# Patient Record
Sex: Female | Born: 1980 | Race: Black or African American | Hispanic: No | Marital: Single | Smoking: Former smoker
Health system: Southern US, Community
[De-identification: ages and names within clinical notes are randomized; demographics above are authoritative.]

## PROBLEM LIST (undated history)

## (undated) DIAGNOSIS — I10 Essential (primary) hypertension: Secondary | ICD-10-CM

## (undated) DIAGNOSIS — R768 Other specified abnormal immunological findings in serum: Secondary | ICD-10-CM

## (undated) DIAGNOSIS — Z789 Other specified health status: Secondary | ICD-10-CM

## (undated) DIAGNOSIS — R011 Cardiac murmur, unspecified: Secondary | ICD-10-CM

## (undated) DIAGNOSIS — O23592 Infection of other part of genital tract in pregnancy, second trimester: Secondary | ICD-10-CM

## (undated) DIAGNOSIS — A599 Trichomoniasis, unspecified: Secondary | ICD-10-CM

## (undated) DIAGNOSIS — A5901 Trichomonal vulvovaginitis: Secondary | ICD-10-CM

## (undated) HISTORY — DX: Infection of other part of genital tract in pregnancy, second trimester: O23.592

## (undated) HISTORY — DX: Other specified health status: Z78.9

## (undated) HISTORY — DX: Other specified abnormal immunological findings in serum: R76.8

## (undated) HISTORY — PX: NO PAST SURGERIES: SHX2092

## (undated) HISTORY — DX: Trichomoniasis, unspecified: A59.9

## (undated) HISTORY — DX: Trichomonal vulvovaginitis: A59.01

---

## 2002-10-11 ENCOUNTER — Other Ambulatory Visit: Admission: RE | Admit: 2002-10-11 | Discharge: 2002-10-11 | Payer: Self-pay | Admitting: Unknown Physician Specialty

## 2010-06-24 ENCOUNTER — Emergency Department (HOSPITAL_COMMUNITY): Admission: EM | Admit: 2010-06-24 | Discharge: 2010-06-24 | Payer: Self-pay | Admitting: Emergency Medicine

## 2010-11-11 LAB — POCT PREGNANCY, URINE: Preg Test, Ur: NEGATIVE

## 2011-12-10 ENCOUNTER — Encounter (HOSPITAL_COMMUNITY): Payer: Self-pay

## 2011-12-10 ENCOUNTER — Emergency Department (HOSPITAL_COMMUNITY)
Admission: EM | Admit: 2011-12-10 | Discharge: 2011-12-10 | Disposition: A | Discharge status: Home / Self Care | Payer: Self-pay | Attending: Emergency Medicine | Admitting: Emergency Medicine

## 2011-12-10 DIAGNOSIS — IMO0001 Reserved for inherently not codable concepts without codable children: Secondary | ICD-10-CM | POA: Insufficient documentation

## 2011-12-10 DIAGNOSIS — R197 Diarrhea, unspecified: Secondary | ICD-10-CM | POA: Insufficient documentation

## 2011-12-10 DIAGNOSIS — B9789 Other viral agents as the cause of diseases classified elsewhere: Secondary | ICD-10-CM | POA: Insufficient documentation

## 2011-12-10 DIAGNOSIS — B349 Viral infection, unspecified: Secondary | ICD-10-CM

## 2011-12-10 DIAGNOSIS — R112 Nausea with vomiting, unspecified: Secondary | ICD-10-CM | POA: Insufficient documentation

## 2011-12-10 LAB — URINALYSIS, ROUTINE W REFLEX MICROSCOPIC
Bilirubin Urine: NEGATIVE
Glucose, UA: NEGATIVE mg/dL
Ketones, ur: NEGATIVE mg/dL
pH: 7 (ref 5.0–8.0)

## 2011-12-10 MED ORDER — DIPHENOXYLATE-ATROPINE 2.5-0.025 MG PO TABS
ORAL_TABLET | ORAL | Status: AC
  Filled 2011-12-10: qty 2

## 2011-12-10 MED ORDER — MORPHINE SULFATE 2 MG/ML IJ SOLN
2.0000 mg | Freq: Once | INTRAMUSCULAR | Status: AC
  Administered 2011-12-10: 2 mg via INTRAVENOUS
  Filled 2011-12-10: qty 1

## 2011-12-10 MED ORDER — ONDANSETRON HCL 4 MG/2ML IJ SOLN
4.0000 mg | Freq: Once | INTRAMUSCULAR | Status: AC
  Administered 2011-12-10: 4 mg via INTRAVENOUS
  Filled 2011-12-10: qty 2

## 2011-12-10 MED ORDER — SODIUM CHLORIDE 0.9 % IV SOLN: Freq: Once | INTRAVENOUS | Status: DC

## 2011-12-10 MED ORDER — KETOROLAC TROMETHAMINE 30 MG/ML IJ SOLN
30.0000 mg | Freq: Once | INTRAMUSCULAR | Status: AC
  Administered 2011-12-10: 30 mg via INTRAVENOUS
  Filled 2011-12-10: qty 1

## 2011-12-10 MED ORDER — DIPHENOXYLATE-ATROPINE 2.5-0.025 MG PO TABS
2.0000 | ORAL_TABLET | Freq: Once | ORAL | Status: AC
  Administered 2011-12-10: 2 via ORAL

## 2011-12-10 MED ORDER — SODIUM CHLORIDE 0.9 % IV BOLUS (SEPSIS)
1000.0000 mL | Freq: Once | INTRAVENOUS | Status: AC
  Administered 2011-12-10: 1000 mL via INTRAVENOUS

## 2011-12-10 MED ORDER — PROMETHAZINE HCL 25 MG PO TABS: 12.5000 mg | ORAL_TABLET | Freq: Four times a day (QID) | ORAL | Status: DC | PRN

## 2011-12-10 NOTE — ED Notes (Signed)
Nausea, vomiting and diarrhea, body aches onset earlier Thursday

## 2011-12-10 NOTE — ED Notes (Signed)
Pt alert & oriented x4, stable gait. Pt given discharge instructions, paperwork & prescription(s). Patient instructed to stop at the registration desk to finish any additional paperwork. pt verbalized understanding. Pt left department w/ no further questions.  

## 2011-12-10 NOTE — ED Provider Notes (Signed)
History     CSN: [id number]  Arrival date & time 12/10/11  7829   First MD Initiated Contact with Patient 12/10/11 0153      Chief Complaint  Patient presents with  . Nausea  . Diarrhea  . Generalized Body Aches    (Consider location/radiation/quality/duration/timing/severity/associated sxs/prior treatment) HPI Olivia Brooks is a 31 y.o. female who presents to the Emergency Department complaining of nausea, vomiting, diarrhea, and body aches that began yesterday morning. She has had multiple episodes of vomiting and diarrhea. Denies fever, cough, shortness of breath. She is taking no medicines. History reviewed. No pertinent past medical history.  History reviewed. No pertinent past surgical history.  No family history on file.  History  Substance Use Topics  . Smoking status: Current Everyday Smoker  . Smokeless tobacco: Not on file  . Alcohol Use: Yes    OB History    Grav Para Term Preterm Abortions TAB SAB Ect Mult Living                  Review of Systems ROS: Statement: All systems negative except as marked or noted in the HPI; Constitutional: Negative for fever  ; Eyes: Negative for eye pain, redness and discharge. ; ; ENMT: Negative for ear pain, hoarseness, nasal congestion, sinus pressure and sore throat. ; ; Cardiovascular: Negative for chest pain, palpitations, diaphoresis, dyspnea and peripheral edema. ; ; Respiratory: Negative for cough, wheezing and stridor. ; ; Gastrointestinal:Positive e for nausea, vomiting, diarrhea, abdominal pain,  Negative for blood in stool, hematemesis, jaundice and rectal bleeding. . ; ; Genitourinary: Negative for dysuria, flank pain and hematuria. ; ; Musculoskeletal: Positive for generalized muscle aches, Negative for back pain and neck pain. Negative for swelling and trauma.; ; Skin: Negative for pruritus, rash, abrasions, blisters, bruising and skin lesion.; ; Neuro: Negative for headache, lightheadedness and neck stiffness.  Negative for weakness, altered level of consciousness , altered mental status, extremity weakness, paresthesias, involuntary movement, seizure and syncope.     Allergies  Review of patient's allergies indicates no known allergies.  Home Medications  No current outpatient prescriptions on file.  BP 147/86  Pulse 93  Temp(Src) 98.6 F (37 C) (Oral)  Resp 16  Ht 5\' 9"  (1.753 m)  Wt 240 lb (108.863 kg)  BMI 35.44 kg/m2  SpO2 100%  LMP 11/28/2011  Physical Exam Physical examination:  Nursing notes reviewed; Vital signs and O2 SAT reviewed;  Constitutional: Well developed, Well nourished, Well hydrated, In no acute distress; Head:  Normocephalic, atraumatic; Eyes: EOMI, PERRL, No scleral icterus; ENMT: Mouth and pharynx normal, Mucous membranes moist; Neck: Supple, Full range of motion, No lymphadenopathy; Cardiovascular: Regular rate and rhythm, No murmur, rub, or gallop; Respiratory: Breath sounds clear & equal bilaterally, No rales, rhonchi, wheezes, or rub, Normal respiratory effort/excursion; Chest: Nontender, Movement normal; Abdomen: Soft, Nontender, Nondistended, Normal bowel sounds; Genitourinary: No CVA tenderness; Extremities: Pulses normal, No tenderness, No edema, No calf edema or asymmetry.; Neuro: AA&Ox3, Major CN grossly intact.  No gross focal motor or sensory deficits in extremities.; Skin: Color normal, Warm, Dry  ED Course  Procedures (including critical care time) Results for orders placed during the hospital encounter of 06/24/10  POCT PREGNANCY, URINE      Component Value Range   Preg Test, Ur       Value: NEGATIVE            THE SENSITIVITY OF THIS     METHODOLOGY IS >24 mIU/mL  MDM  She presents with nausea, vomiting, diarrhea, and muscle aches that began yesterday. Given IV fluids, and diabetic, analgesics, anti-inflammatory, and Lomotil with improvement. She is unable to take by mouth fluids.Pt stable in ED with no significant deterioration in  condition.The patient appears reasonably screened and/or stabilized for discharge and I doubt any other medical condition or other [facility] requiring further screening, evaluation, or treatment in the ED at this time prior to discharge.  MDM Reviewed: nursing note and vitals Interpretation: labs           Nicoletta Dress. Colon Branch, MD 12/10/11 [phone]

## 2011-12-10 NOTE — Discharge Instructions (Signed)
Drink lots of fluids.  Bland diet for the next 6-8 hours then progress as you can tolerate. Use the nausea medicine as needed. Use BRAT for diarrhea. Follow up with your doctor.   B.R.A.T. Diet Your doctor has recommended the B.R.A.T. diet for you or your child until the condition improves. This is often used to help control diarrhea and vomiting symptoms. If you or your child can tolerate clear liquids, you may have:  Bananas.   Rice.   Applesauce.   Toast (and other simple starches such as crackers, potatoes, noodles).  Be sure to avoid dairy products, meats, and fatty foods until symptoms are better. Fruit juices such as apple, grape, and prune juice can make diarrhea worse. Avoid these. Continue this diet for 2 days or as instructed by your caregiver. Document Released: 08/16/2005 Document Revised: 08/05/2011 Document Reviewed: 02/02/2007 Exit[employer] Patient Information 2012 [employer]. 

## 2014-04-21 ENCOUNTER — Encounter (HOSPITAL_COMMUNITY): Payer: Self-pay | Admitting: Emergency Medicine

## 2014-04-21 ENCOUNTER — Emergency Department (HOSPITAL_COMMUNITY): Payer: Managed Care, Other (non HMO)

## 2014-04-21 ENCOUNTER — Emergency Department (HOSPITAL_COMMUNITY)
Admission: EM | Admit: 2014-04-21 | Discharge: 2014-04-21 | Disposition: A | Discharge status: Home / Self Care | Payer: Managed Care, Other (non HMO) | Attending: Emergency Medicine | Admitting: Emergency Medicine

## 2014-04-21 DIAGNOSIS — M79605 Pain in left leg: Secondary | ICD-10-CM

## 2014-04-21 DIAGNOSIS — F17200 Nicotine dependence, unspecified, uncomplicated: Secondary | ICD-10-CM | POA: Insufficient documentation

## 2014-04-21 DIAGNOSIS — Z3202 Encounter for pregnancy test, result negative: Secondary | ICD-10-CM | POA: Diagnosis not present

## 2014-04-21 DIAGNOSIS — M79609 Pain in unspecified limb: Secondary | ICD-10-CM | POA: Diagnosis not present

## 2014-04-21 LAB — POC URINE PREG, ED: PREG TEST UR: NEGATIVE

## 2014-04-21 MED ORDER — NAPROXEN 250 MG PO TABS
500.0000 mg | ORAL_TABLET | Freq: Once | ORAL | Status: AC
  Administered 2014-04-21: 500 mg via ORAL
  Filled 2014-04-21: qty 2

## 2014-04-21 MED ORDER — METHOCARBAMOL 500 MG PO TABS: 500.0000 mg | ORAL_TABLET | Freq: Two times a day (BID) | ORAL | Status: DC | PRN

## 2014-04-21 MED ORDER — NAPROXEN 500 MG PO TABS: 500.0000 mg | ORAL_TABLET | Freq: Two times a day (BID) | ORAL | Status: DC

## 2014-04-21 NOTE — ED Provider Notes (Signed)
CSN: [id number]     Arrival date & time 04/21/14  1919 History  This chart was scribed for Johnna Acosta, MD by Cathie Hoops, ED Scribe. The patient was seen in [place]. The patient's care was started at 8:54 PM.     Chief Complaint  Patient presents with  . Leg Pain    HPI HPI Comments: Olivia Brooks is a 33 y.o. female who presents to the Emergency Department complaining of new, gradually worsening upper left leg pain. Pt describes her pain as deep and throbbing. Pt reports pain with ROM. Pt reports similar symptoms when she is menstruating but always resolves once she stops menstruating. Pt denies pregnancy. No bug bites, rashes or redness, fever, chills, abdominal pain, appetite change . No pain in upper extremities. Pt denies diarrhea. 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 Pt denies stretching or exercising.  She does not stretch or exercise.  She has had normal menstrual cycles with regards to bleeding.  No trauma.  No swelling.  No other rf for DVT  Good pulses of the feet History reviewed. No pertinent past medical history. History reviewed. No pertinent past surgical history. History reviewed. No pertinent family history. History  Substance Use Topics  . Smoking status: Current Every Day Smoker  . Smokeless tobacco: Not on file  . Alcohol Use: Yes   OB History   Grav Para Term Preterm Abortions TAB SAB Ect Mult Living                 Review of Systems  Musculoskeletal: Positive for myalgias. Negative for back pain, joint swelling and neck stiffness.  Skin: Negative for color change, rash and wound.      Allergies  Review of patient's allergies indicates no known allergies.  Home Medications   Prior to Admission medications   Medication Sig Start Date End Date Taking? Authorizing Provider  naproxen sodium (ALEVE) 220 MG tablet Take 220-440 mg by mouth daily as needed (for pain).   Yes Historical Provider, MD  methocarbamol (ROBAXIN) 500 MG tablet  Take 1 tablet (500 mg total) by mouth 2 (two) times daily as needed for muscle spasms. 04/21/14   Johnna Acosta, MD  naproxen (NAPROSYN) 500 MG tablet Take 1 tablet (500 mg total) by mouth 2 (two) times daily with a meal. 04/21/14   Johnna Acosta, MD  promethazine (PHENERGAN) 25 MG tablet Take 0.5 tablets (12.5 mg total) by mouth every 6 (six) hours as needed for nausea. 12/10/11 12/17/11  Prentiss Bells, MD   Triage Vitals: BP 160/106  Pulse 89  Temp(Src) 98.6 F (37 C) (Oral)  Resp 20  Ht 5\' 8"  (1.727 m)  Wt 283 lb 8 oz (128.595 kg)  BMI 43.12 kg/m2  SpO2 100%  LMP 04/12/2014 Physical Exam  Nursing note and vitals reviewed. Constitutional: She appears well-developed and well-nourished. No distress.  HENT:  Head: Normocephalic and atraumatic.  Mouth/Throat: Oropharynx is clear and moist. No oropharyngeal exudate.  Eyes: Conjunctivae and EOM are normal. Pupils are equal, round, and reactive to light. Right eye exhibits no discharge. Left eye exhibits no discharge. No scleral icterus.  Neck: Normal range of motion. Neck supple. No JVD present. No thyromegaly present.  Cardiovascular: Normal rate, regular rhythm, normal heart sounds and intact distal pulses.  Exam reveals no gallop and no friction rub.   No murmur heard. Pulmonary/Chest: Effort normal and breath sounds normal. No respiratory distress. She has no wheezes. She has no rales.  Abdominal: Soft. Bowel sounds are normal. She exhibits no distension and no mass. There is no tenderness.  Musculoskeletal: Normal range of motion. She exhibits tenderness ( ttp with use of hip flexors.  no rash, reprotudible ttp to palpation of proximal thigh.  no pain with ROM of hip or knee.). She exhibits no edema.  Lymphadenopathy:    She has no cervical adenopathy.  Neurological: She is alert. Coordination normal.  Skin: Skin is warm and dry. No rash noted. No erythema.  Psychiatric: She has a normal mood and affect. Her behavior is normal.     ED Course  Procedures (including critical care time) DIAGNOSTIC STUDIES: Oxygen Saturation is 100% on RA, normal by my interpretation.    COORDINATION OF CARE: 8:59 PM- Patient informed of current plan for treatment and evaluation and agrees with plan at this time.    Labs Review Labs Reviewed  POC URINE PREG, ED    Imaging Review Dg Femur Left  04/21/2014   CLINICAL DATA:  Pain  EXAM: LEFT FEMUR - 2 VIEW  COMPARISON:  None.  FINDINGS: Frontal and lateral views were obtained. There is no fracture or dislocation. There is mild narrowing in the left hip joint. No erosive change. No abnormal periosteal reaction.  IMPRESSION: Mild narrowing left hip joint. No fracture or dislocation. No abnormal periosteal reaction.   Electronically Signed   By: Lowella Grip M.D.   On: 04/21/2014 21:56  remin    MDM   Final diagnoses:  Leg pain, anterior, left    Pt has muscular pai - r/o bony pathology.  Pain meds ordered.  Negative for acute fracture, medications given as below. Likely muscle injury  I personally performed the services described in this documentation, which was scribed in my presence. The recorded information has been reviewed and is accurate.  Meds given in ED:  Medications  naproxen (NAPROSYN) tablet 500 mg (500 mg Oral Given 04/21/14 2118)    New Prescriptions   METHOCARBAMOL (ROBAXIN) 500 MG TABLET    Take 1 tablet (500 mg total) by mouth 2 (two) times daily as needed for muscle spasms.   NAPROXEN (NAPROSYN) 500 MG TABLET    Take 1 tablet (500 mg total) by mouth 2 (two) times daily with a meal.     .  Johnna Acosta, MD 04/21/14 2329

## 2014-04-21 NOTE — Discharge Instructions (Signed)
Please have your blood pressure rechecked in 2 weeks - it was elevated today.    Your xray was normal.  Please call your doctor for a followup appointment within 24-48 hours. When you talk to your doctor please let them know that you were seen in the emergency department and have them acquire all of your records so that they can discuss the findings with you and formulate a treatment plan to fully care for your new and ongoing problems.   [facility] Primary Care Doctor List    Sinda Du MD. Specialty: Pulmonary Disease Contact information: [place] [address]  [phone]   Tula Nakayama, MD. Specialty: [facility] Medicine Contact information: [address], [address] [place] [address]  [phone]   Sallee Lange, MD. Specialty: [facility] Medicine Contact information: [place]  [place] [address]  [phone]   Rosita Fire, MD Specialty: Internal Medicine Contact information: [place] [address]  [phone]   Delphina Cahill, MD. Specialty: Internal Medicine Contact information: [place] [address]  [phone]   Marjean Donna, MD. Specialty: Family Medicine Contact information: [place] [address]  [phone]   Leslie Andrea, MD. Specialty: [facility] Medicine Contact information: [place] [address]  [phone]   Asencion Noble, MD. Specialty: Internal Medicine Contact information: [place]  [place] [address]  [phone]

## 2014-04-21 NOTE — ED Notes (Signed)
Pt c/o left leg pain x 3 weeks;

## 2014-12-19 ENCOUNTER — Emergency Department (HOSPITAL_COMMUNITY)
Admission: EM | Admit: 2014-12-19 | Discharge: 2014-12-20 | Disposition: A | Discharge status: Home / Self Care | Payer: BLUE CROSS/BLUE SHIELD | Attending: Emergency Medicine | Admitting: Emergency Medicine

## 2014-12-19 ENCOUNTER — Emergency Department (HOSPITAL_COMMUNITY): Payer: BLUE CROSS/BLUE SHIELD

## 2014-12-19 ENCOUNTER — Encounter (HOSPITAL_COMMUNITY): Payer: Self-pay | Admitting: Cardiology

## 2014-12-19 DIAGNOSIS — O99331 Smoking (tobacco) complicating pregnancy, first trimester: Secondary | ICD-10-CM | POA: Diagnosis not present

## 2014-12-19 DIAGNOSIS — M5441 Lumbago with sciatica, right side: Secondary | ICD-10-CM

## 2014-12-19 DIAGNOSIS — O9989 Other specified diseases and conditions complicating pregnancy, childbirth and the puerperium: Secondary | ICD-10-CM | POA: Diagnosis present

## 2014-12-19 DIAGNOSIS — M5440 Lumbago with sciatica, unspecified side: Secondary | ICD-10-CM | POA: Diagnosis not present

## 2014-12-19 DIAGNOSIS — O99211 Obesity complicating pregnancy, first trimester: Secondary | ICD-10-CM | POA: Insufficient documentation

## 2014-12-19 DIAGNOSIS — Z3A01 Less than 8 weeks gestation of pregnancy: Secondary | ICD-10-CM | POA: Insufficient documentation

## 2014-12-19 DIAGNOSIS — E669 Obesity, unspecified: Secondary | ICD-10-CM | POA: Insufficient documentation

## 2014-12-19 DIAGNOSIS — F17200 Nicotine dependence, unspecified, uncomplicated: Secondary | ICD-10-CM | POA: Diagnosis not present

## 2014-12-19 DIAGNOSIS — M5442 Lumbago with sciatica, left side: Secondary | ICD-10-CM

## 2014-12-19 DIAGNOSIS — O99891 Other specified diseases and conditions complicating pregnancy: Secondary | ICD-10-CM

## 2014-12-19 DIAGNOSIS — M549 Dorsalgia, unspecified: Secondary | ICD-10-CM

## 2014-12-19 DIAGNOSIS — Z3490 Encounter for supervision of normal pregnancy, unspecified, unspecified trimester: Secondary | ICD-10-CM

## 2014-12-19 LAB — URINALYSIS, ROUTINE W REFLEX MICROSCOPIC
BILIRUBIN URINE: NEGATIVE
Glucose, UA: NEGATIVE mg/dL
HGB URINE DIPSTICK: NEGATIVE
KETONES UR: NEGATIVE mg/dL
Leukocytes, UA: NEGATIVE
Nitrite: NEGATIVE
PROTEIN: NEGATIVE mg/dL
Urobilinogen, UA: 0.2 mg/dL (ref 0.0–1.0)
pH: 6 (ref 5.0–8.0)

## 2014-12-19 LAB — I-STAT BETA HCG BLOOD, ED (MC, WL, AP ONLY): I-stat hCG, quantitative: >2000 m[IU]/mL — ABNORMAL HIGH (ref <5)

## 2014-12-19 MED ORDER — ACETAMINOPHEN-CODEINE #3 300-30 MG PO TABS: 1.0000 | ORAL_TABLET | ORAL | Status: DC | PRN

## 2014-12-19 MED ORDER — HYDROCODONE-ACETAMINOPHEN 5-325 MG PO TABS
1.0000 | ORAL_TABLET | Freq: Once | ORAL | Status: AC
  Administered 2014-12-19: 1 via ORAL
  Filled 2014-12-19: qty 1

## 2014-12-19 NOTE — Discharge Instructions (Signed)

## 2014-12-19 NOTE — ED Notes (Addendum)
Lower back pain that radiates into both legs.    Pt is pregnant.

## 2014-12-19 NOTE — ED Provider Notes (Signed)
CSN: [id number]     Arrival date & time 12/19/14  1745 History   First MD Initiated Contact with Patient 12/19/14 1838     Chief Complaint  Patient presents with  . Back Pain     (Consider location/radiation/quality/duration/timing/severity/associated sxs/prior Treatment) HPI  Olivia Brooks is a 34 y.o. female [id number] who presents to the Emergency Department complaining of diffuse, low back pain for 2 days.  She states that she recently had a confirmed pregnancy test at her PMD's office and has initial appt with her OB on Monday.  She c/o sharp pain to her lower back and that radiates into her buttocks and down the backside of both legs.  She states pain is worse with movement, twisting and walking.  She states she stands all day at her job, but denies known injury.  She has taken one ibuprofen yesterday which did not alleviate her pain.  She denies chest pain, shortness of breath, abdominal pain, pelvic pain, vaginal bleeding or dysuria.     History reviewed. No pertinent past medical history. History reviewed. No pertinent past surgical history. History reviewed. No pertinent family history. History  Substance Use Topics  . Smoking status: Current Every Day Smoker  . Smokeless tobacco: Not on file  . Alcohol Use: Yes   OB History    No data available     Review of Systems  Constitutional: Negative for fever.  Respiratory: Negative for chest tightness and shortness of breath.   Cardiovascular: Negative for chest pain.  Gastrointestinal: Negative for nausea, vomiting, abdominal pain and constipation.  Genitourinary: Negative for dysuria, frequency, hematuria, flank pain, decreased urine volume, vaginal bleeding, vaginal discharge and difficulty urinating.  Musculoskeletal: Positive for back pain. Negative for joint swelling.  Skin: Negative for rash.  Neurological: Negative for weakness and numbness.  All other systems reviewed and are negative.     Allergies  Review  of patient's allergies indicates no known allergies.  Home Medications   Prior to Admission medications   Medication Sig Start Date End Date Taking? Authorizing Provider  ibuprofen (ADVIL,MOTRIN) 200 MG tablet Take 800 mg by mouth every 6 (six) hours as needed for headache, mild pain or moderate pain.   Yes Historical Provider, MD  acetaminophen-codeine (TYLENOL #3) 300-30 MG per tablet Take 1 tablet by mouth every 4 (four) hours as needed for moderate pain. 12/19/14   Daquisha Clermont, PA-C  methocarbamol (ROBAXIN) 500 MG tablet Take 1 tablet (500 mg total) by mouth 2 (two) times daily as needed for muscle spasms. Patient not taking: Reported on 12/19/2014 04/21/14   Noemi Chapel, MD  naproxen (NAPROSYN) 500 MG tablet Take 1 tablet (500 mg total) by mouth 2 (two) times daily with a meal. Patient not taking: Reported on 12/19/2014 04/21/14   Noemi Chapel, MD   BP 138/62 mmHg  Pulse 70  Temp(Src) 98.2 F (36.8 C) (Oral)  Resp 20  Ht 5\' 8"  (1.727 m)  Wt 300 lb (136.079 kg)  BMI 45.63 kg/m2  SpO2 99%  LMP 11/02/2014 Physical Exam  Constitutional: She is oriented to person, place, and time. She appears well-developed and well-nourished. No distress.  Pt is obese  HENT:  Head: Normocephalic and atraumatic.  Neck: Normal range of motion. Neck supple.  Cardiovascular: Normal rate, regular rhythm, normal heart sounds and intact distal pulses.   No murmur heard. Pulmonary/Chest: Effort normal and breath sounds normal. No respiratory distress. She exhibits no tenderness.  Abdominal: Soft. She exhibits no distension. There is no  tenderness. There is no rebound and no guarding.  Musculoskeletal: Normal range of motion. She exhibits tenderness. She exhibits no edema.       Lumbar back: She exhibits tenderness and pain. She exhibits normal range of motion, no swelling, no deformity, no laceration and normal pulse.  Diffuse ttp of the bilateral lumbar paraspinal muscles.  No spinal tenderness.  DP  pulses are brisk and symmetrical.  Distal sensation intact.  Hip Flexors/Extensors are intact.  Pt has 5/5 strength against resistance of bilateral lower extremities.     Neurological: She is alert and oriented to person, place, and time. She has normal strength. No sensory deficit. She exhibits normal muscle tone. Coordination and gait normal.  Reflex Scores:      Patellar reflexes are 2+ on the right side and 2+ on the left side.      Achilles reflexes are 2+ on the right side and 2+ on the left side. Skin: Skin is warm and dry. No rash noted.  Nursing note and vitals reviewed.   ED Course  Procedures (including critical care time) Labs Review Labs Reviewed  URINALYSIS, ROUTINE W REFLEX MICROSCOPIC - Abnormal; Notable for the following:    Specific Gravity, Urine >1.030 (*)    All other components within normal limits  I-STAT BETA HCG BLOOD, ED ([facility], [facility], [facility] ONLY) - Abnormal; Notable for the following:    I-stat hCG, quantitative >2000.0 (*)    All other components within normal limits    Imaging Review US Ob Comp Less 14 Wks  12/19/2014   CLINICAL DATA:  Back pain, pregnant.  EXAM: OBSTETRIC <14 WK [place] AND TRANSVAGINAL OB US  TECHNIQUE: Both transabdominal and transvaginal ultrasound examinations were performed for complete evaluation of the gestation as well as the maternal uterus, adnexal regions, and pelvic cul-de-sac. Transvaginal technique was performed to assess early pregnancy.  COMPARISON:  None.  FINDINGS: Intrauterine gestational sac: Visualized/normal in shape.  Yolk sac:  Identified  Embryo:  Identified  Cardiac Activity: Identified  Heart Rate: 145  bpm  CRL:  12  mm   7 w   3 d                  [place] EDC: 08/04/2015  Maternal uterus/adnexae: No subchorionic hemorrhage. Intramural fibroid on the left measuring 3.8 x 3.3 x 3.8 cm. Ovaries not visualized. Trace free fluid.  IMPRESSION: Single intrauterine gestation with cardiac activity documented. Estimated age of 7 weeks 3 days by  crown-rump length.  Fibroid uterus.  Ovaries not visualized.  Trace free fluid.   Electronically Signed   By: Carlos Levering M.D.   On: 12/19/2014 23:06   US Ob Transvaginal  12/19/2014   CLINICAL DATA:  Back pain, pregnant.  EXAM: OBSTETRIC <14 WK [place] AND TRANSVAGINAL OB US  TECHNIQUE: Both transabdominal and transvaginal ultrasound examinations were performed for complete evaluation of the gestation as well as the maternal uterus, adnexal regions, and pelvic cul-de-sac. Transvaginal technique was performed to assess early pregnancy.  COMPARISON:  None.  FINDINGS: Intrauterine gestational sac: Visualized/normal in shape.  Yolk sac:  Identified  Embryo:  Identified  Cardiac Activity: Identified  Heart Rate: 145  bpm  CRL:  12  mm   7 w   3 d                  [place] EDC: 08/04/2015  Maternal uterus/adnexae: No subchorionic hemorrhage. Intramural fibroid on the left measuring 3.8 x 3.3 x 3.8 cm. Ovaries not  visualized. Trace free fluid.  IMPRESSION: Single intrauterine gestation with cardiac activity documented. Estimated age of 7 weeks 3 days by crown-rump length.  Fibroid uterus.  Ovaries not visualized.  Trace free fluid.   Electronically Signed   By: Carlos Levering M.D.   On: 12/19/2014 23:06     EKG Interpretation None      MDM   Final diagnoses:  Bilateral low back pain with sciatica, sciatica laterality unspecified  Pregnancy    Pt is ambulatory in the dept w/o difficulty, no focal neuro deficits on exam.  Diffuse radicular low back pain that radiates to bilateral lower extremities.  Pain likely related to sciatica.  No concerning sx's for emergent neurological process.   Pt also seen by Dr. Wyvonnia Dusky and care plan discussed.  Unable to view fetus by bedside US.  Will order transvaginal US to r/o ectopic.  No abd pain, vag bleeding, chest pain or dyspnea.  Pt has been resting comfortably during ED stay, family at bedside.  Discussed labs and [place] results with her.  She has appt on Monday with  OB, [facility].  She was advised to return here for any worsening sx's.  Pt verbalized understanding and agrees to plan.   Kem Parkinson, PA-C 12/20/14 0013  Ezequiel Essex, MD 12/20/14 [phone]

## 2014-12-20 ENCOUNTER — Encounter: Payer: Self-pay | Admitting: *Deleted

## 2014-12-20 ENCOUNTER — Other Ambulatory Visit: Payer: Self-pay | Admitting: Obstetrics & Gynecology

## 2014-12-23 ENCOUNTER — Other Ambulatory Visit: Payer: Self-pay

## 2015-01-01 ENCOUNTER — Ambulatory Visit (INDEPENDENT_AMBULATORY_CARE_PROVIDER_SITE_OTHER): Payer: BLUE CROSS/BLUE SHIELD | Admitting: Women's Health

## 2015-01-01 ENCOUNTER — Encounter: Payer: Self-pay | Admitting: Women's Health

## 2015-01-01 VITALS — BP 142/80 | HR 68 | Wt 308.0 lb

## 2015-01-01 DIAGNOSIS — Z0283 Encounter for blood-alcohol and blood-drug test: Secondary | ICD-10-CM

## 2015-01-01 DIAGNOSIS — E669 Obesity, unspecified: Secondary | ICD-10-CM

## 2015-01-01 DIAGNOSIS — A5901 Trichomonal vulvovaginitis: Secondary | ICD-10-CM | POA: Diagnosis not present

## 2015-01-01 DIAGNOSIS — O23591 Infection of other part of genital tract in pregnancy, first trimester: Secondary | ICD-10-CM

## 2015-01-01 DIAGNOSIS — Z331 Pregnant state, incidental: Secondary | ICD-10-CM

## 2015-01-01 DIAGNOSIS — Z1389 Encounter for screening for other disorder: Secondary | ICD-10-CM

## 2015-01-01 DIAGNOSIS — Z369 Encounter for antenatal screening, unspecified: Secondary | ICD-10-CM

## 2015-01-01 DIAGNOSIS — O09899 Supervision of other high risk pregnancies, unspecified trimester: Secondary | ICD-10-CM | POA: Insufficient documentation

## 2015-01-01 DIAGNOSIS — Z3682 Encounter for antenatal screening for nuchal translucency: Secondary | ICD-10-CM

## 2015-01-01 DIAGNOSIS — O26851 Spotting complicating pregnancy, first trimester: Secondary | ICD-10-CM | POA: Insufficient documentation

## 2015-01-01 DIAGNOSIS — O3410 Maternal care for benign tumor of corpus uteri, unspecified trimester: Secondary | ICD-10-CM

## 2015-01-01 DIAGNOSIS — Z3401 Encounter for supervision of normal first pregnancy, first trimester: Secondary | ICD-10-CM

## 2015-01-01 DIAGNOSIS — D259 Leiomyoma of uterus, unspecified: Secondary | ICD-10-CM | POA: Insufficient documentation

## 2015-01-01 LAB — POCT WET PREP (WET MOUNT): Clue Cells Wet Prep Whiff POC: NEGATIVE

## 2015-01-01 MED ORDER — METRONIDAZOLE 500 MG PO TABS: 2000.0000 mg | ORAL_TABLET | Freq: Once | ORAL | Status: DC

## 2015-01-01 MED ORDER — PRENATAL PLUS 27-1 MG PO TABS: 1.0000 | ORAL_TABLET | Freq: Every day | ORAL | Status: DC

## 2015-01-01 NOTE — Patient Instructions (Signed)

## 2015-01-01 NOTE — Progress Notes (Addendum)
  Subjective:  Olivia Brooks is a 34 y.o. G22P0 African American female at [redacted]w[redacted]d by 7wk u/s,  being seen today for her first obstetrical visit.  Her obstetrical history is significant for obesity & primigravida.  Pregnancy history fully reviewed.  Patient reports light spotting x 1wk, no recent sex, just when wipes. Some LBP- went to hospital for LBP- was given tylenol #3. Denies vb, cramping, uti s/s, abnormal/malodorous vag d/c, or vulvovaginal itching/irritation.  BP 142/80 mmHg  Pulse 68  Wt 308 lb (139.708 kg)  LMP 11/02/2014  HISTORY: OB History  Gravida Para Term Preterm AB SAB TAB Ectopic Multiple Living  1             # Outcome Date GA Lbr Len/2nd Weight Sex Delivery Anes PTL Lv  1 Current              Past Medical History  Diagnosis Date  . Medical history non-contributory    Past Surgical History  Procedure Laterality Date  . No past surgeries     Family History  Problem Relation Age of Onset  . Cancer Mother   . Hyperlipidemia Sister     Exam   System:     General: Well developed & nourished, no acute distress   Skin: Warm & dry, normal coloration and turgor, no rashes   Neurologic: Alert & oriented, normal mood   Cardiovascular: Regular rate & rhythm   Respiratory: Effort & rate normal, LCTAB, acyanotic   Abdomen: Soft, non tender   Extremities: normal strength, tone   Pelvic Exam:    Perineum: Normal perineum   Vulva: Normal, no lesions   Vagina:  Normal mucosa, thin white malodorous d/c, scant spotting   Cervix: Normal, bulbous, appears closed   Uterus: Normal size/shape/contour for GA   Thin prep pap smear 2014 @ [facility], neg per pt  FHR: will get in w/ [facility] to verify fhr   Assessment:   Pregnancy: G1P0 Patient Active Problem List   Diagnosis Date Noted  . Supervision of normal first pregnancy 01/01/2015    Priority: High  . Uterine fibroid during pregnancy, antepartum 01/01/2015  . Spotting affecting pregnancy in first trimester,  antepartum 01/01/2015  . Trichomonal vaginitis during pregnancy in first trimester 01/01/2015    [redacted]w[redacted]d G1P0 New OB visit Elevated bp, no h/o HTN Trichomonas Spotting Mild nausea of pregnancy Obesity   Plan:  Initial labs drawn Continue prenatal vitamins Problem list reviewed and updated Reviewed n/v relief measures and warning s/s to report Reviewed recommended weight gain based on pre-gravid BMI Encouraged well-balanced diet Genetic Screening discussed Integrated Screen: requested Cystic fibrosis screening discussed requested Ultrasound discussed; fetal survey: requested Follow up in 2 weeks for nt/it and visit [place] completed Rx flagyl 2gm po x 1 for trich, to call back w/ partner's name/dob/pharmacy/allergies Only take tylenol#3 if back pain severe, discussed other relief measures Monitor bp Request pap from [facility]  Tawnya Crook CNM, [facility] 01/01/2015 9:26 AM

## 2015-01-02 LAB — URINE CULTURE: ORGANISM ID, BACTERIA: NO GROWTH

## 2015-01-03 LAB — GC/CHLAMYDIA PROBE AMP
CHLAMYDIA, DNA PROBE: NEGATIVE
NEISSERIA GONORRHOEAE BY PCR: NEGATIVE

## 2015-01-06 ENCOUNTER — Telehealth: Payer: Self-pay | Admitting: *Deleted

## 2015-01-06 LAB — PMP SCREEN PROFILE (10S), URINE
Amphetamine Screen, Ur: NEGATIVE ng/mL
Barbiturate Screen, Ur: NEGATIVE ng/mL
Benzodiazepine Screen, Urine: NEGATIVE ng/mL
Cannabinoids Ur Ql Scn: NEGATIVE ng/mL
Cocaine(Metab.)Screen, Urine: NEGATIVE ng/mL
Creatinine(Crt), U: 127.4 mg/dL (ref 20.0–300.0)
METHADONE SCREEN, URINE: NEGATIVE ng/mL
OPIATE SCRN UR: POSITIVE ng/mL
OXYCODONE+OXYMORPHONE UR QL SCN: NEGATIVE ng/mL
PCP Scrn, Ur: NEGATIVE ng/mL
PROPOXYPHENE SCREEN: NEGATIVE ng/mL
Ph of Urine: 6.6 (ref 4.5–8.9)

## 2015-01-06 LAB — CYSTIC FIBROSIS MUTATION 97: GENE DIS ANAL CARRIER INTERP BLD/T-IMP: NOT DETECTED

## 2015-01-06 LAB — CBC
Hematocrit: 35.4 % (ref 34.0–46.6)
Hemoglobin: 11.9 g/dL (ref 11.1–15.9)
MCH: 27.4 pg (ref 26.6–33.0)
MCHC: 33.6 g/dL (ref 31.5–35.7)
MCV: 81 fL (ref 79–97)
PLATELETS: 194 10*3/uL (ref 150–379)
RBC: 4.35 x10E6/uL (ref 3.77–5.28)
RDW: 14.6 % (ref 12.3–15.4)
WBC: 6.7 10*3/uL (ref 3.4–10.8)

## 2015-01-06 LAB — URINALYSIS, ROUTINE W REFLEX MICROSCOPIC
Bilirubin, UA: NEGATIVE
GLUCOSE, UA: NEGATIVE
KETONES UA: NEGATIVE
LEUKOCYTES UA: NEGATIVE
NITRITE UA: NEGATIVE
PROTEIN UA: NEGATIVE
RBC, UA: NEGATIVE
SPEC GRAV UA: 1.021 (ref 1.005–1.030)
Urobilinogen, Ur: 0.2 mg/dL (ref 0.2–1.0)
pH, UA: 7 (ref 5.0–7.5)

## 2015-01-06 LAB — RUBELLA SCREEN: RUBELLA: 2.15 {index} (ref >0.99)

## 2015-01-06 LAB — SICKLE CELL SCREEN: SICKLE CELL SCREEN: NEGATIVE

## 2015-01-06 LAB — VARICELLA ZOSTER ANTIBODY, IGG: Varicella zoster IgG: 3736 index (ref >165)

## 2015-01-06 LAB — ABO/RH: RH TYPE: POSITIVE

## 2015-01-06 LAB — HIV ANTIBODY (ROUTINE TESTING W REFLEX): HIV SCREEN 4TH GENERATION: NONREACTIVE

## 2015-01-06 LAB — RPR: RPR Ser Ql: NONREACTIVE

## 2015-01-06 LAB — ANTIBODY SCREEN: Antibody Screen: NEGATIVE

## 2015-01-06 LAB — HEPATITIS B SURFACE ANTIGEN: Hepatitis B Surface Ag: NEGATIVE

## 2015-01-07 NOTE — Telephone Encounter (Signed)
Pt states went to ER this weekend still positive results for Trich. Pt states took the Flagyl as Knute Neu, CNM prescribed. Pt states should she have a positive Trich when she took the Flagyl on the 01/02/2015?  Per Derrek Monaco, [place] for trich 10 days. Pt verbalized understanding and has next appt scheduled.

## 2015-01-13 ENCOUNTER — Other Ambulatory Visit: Payer: Self-pay | Admitting: Women's Health

## 2015-01-13 ENCOUNTER — Ambulatory Visit (INDEPENDENT_AMBULATORY_CARE_PROVIDER_SITE_OTHER): Payer: BLUE CROSS/BLUE SHIELD | Admitting: Women's Health

## 2015-01-13 ENCOUNTER — Telehealth: Payer: Self-pay | Admitting: *Deleted

## 2015-01-13 ENCOUNTER — Encounter: Payer: Self-pay | Admitting: Women's Health

## 2015-01-13 ENCOUNTER — Ambulatory Visit (INDEPENDENT_AMBULATORY_CARE_PROVIDER_SITE_OTHER): Payer: BLUE CROSS/BLUE SHIELD

## 2015-01-13 VITALS — BP 148/78 | HR 68 | Wt 310.0 lb

## 2015-01-13 DIAGNOSIS — O10919 Unspecified pre-existing hypertension complicating pregnancy, unspecified trimester: Secondary | ICD-10-CM

## 2015-01-13 DIAGNOSIS — Z331 Pregnant state, incidental: Secondary | ICD-10-CM

## 2015-01-13 DIAGNOSIS — O3680X1 Pregnancy with inconclusive fetal viability, fetus 1: Secondary | ICD-10-CM | POA: Diagnosis not present

## 2015-01-13 DIAGNOSIS — Z1389 Encounter for screening for other disorder: Secondary | ICD-10-CM

## 2015-01-13 DIAGNOSIS — Z3682 Encounter for antenatal screening for nuchal translucency: Secondary | ICD-10-CM

## 2015-01-13 DIAGNOSIS — O23591 Infection of other part of genital tract in pregnancy, first trimester: Secondary | ICD-10-CM

## 2015-01-13 DIAGNOSIS — E669 Obesity, unspecified: Secondary | ICD-10-CM

## 2015-01-13 DIAGNOSIS — O26851 Spotting complicating pregnancy, first trimester: Secondary | ICD-10-CM

## 2015-01-13 DIAGNOSIS — O10912 Unspecified pre-existing hypertension complicating pregnancy, second trimester: Secondary | ICD-10-CM

## 2015-01-13 DIAGNOSIS — A5901 Trichomonal vulvovaginitis: Secondary | ICD-10-CM | POA: Diagnosis not present

## 2015-01-13 DIAGNOSIS — O09891 Supervision of other high risk pregnancies, first trimester: Secondary | ICD-10-CM

## 2015-01-13 DIAGNOSIS — Z3401 Encounter for supervision of normal first pregnancy, first trimester: Secondary | ICD-10-CM

## 2015-01-13 LAB — POCT URINALYSIS DIPSTICK
Blood, UA: NEGATIVE
GLUCOSE UA: NEGATIVE
Ketones, UA: NEGATIVE
Leukocytes, UA: NEGATIVE
Nitrite, UA: NEGATIVE
Protein, UA: NEGATIVE

## 2015-01-13 LAB — POCT WET PREP (WET MOUNT): Clue Cells Wet Prep Whiff POC: NEGATIVE

## 2015-01-13 MED ORDER — LABETALOL HCL 200 MG PO TABS: 200.0000 mg | ORAL_TABLET | Freq: Two times a day (BID) | ORAL | Status: DC

## 2015-01-13 MED ORDER — COMPLETENATE 29-1 MG PO CHEW: 1.0000 | CHEWABLE_TABLET | Freq: Every day | ORAL | Status: DC

## 2015-01-13 NOTE — Progress Notes (Signed)
High Risk Pregnancy Diagnosis(es): CHTN dx today G1P0 [redacted]w[redacted]d Estimated Date of Delivery: 08/04/15 BP 148/78 mmHg  Pulse 68  Wt 310 lb (140.615 kg)  LMP 11/02/2014  Urinalysis: Negative HPI:  Still having light pink spotting daily, took flagyl as directed for trichomonas, hasn't had sex since. No cramping. PNV makes her sick, wants NataChew- rx sent in.  BP, weight, and urine reviewed.  No fm yet. Denies cramping, lof, vb, uti s/s.   Fundal Height:  11wks Fetal Heart rate:  168 u/s Edema:  None Spec exam: cx visually closed, no bleeding, scant amount creamy white nonodorous d/c, wet prep: trich neg, all other neg as well  Scheduled for 1st nt/it today, however amber measuring baby at 10wks- too small for NT today. EDC of 12/5 based on [facility] c/w 7wk fetus at [facility]- will keep this edc, and redo NT in 2wks Reviewed warning s/s to report All questions were answered Assessment: [redacted]w[redacted]d CHTN dx today Medication(s) Plans:  rx labetalol 200mg  BID, begin baby asa daily @ 12wks, rx natachew per pt preference Treatment Plan:  Growth u/s at 59, 69, 19, 38wks, 2x/wk testing at 32wks, deliver @ 39wk Follow up in 2wks for high-risk OB appt and 1st nt/it 24hr urine for baseline protein today

## 2015-01-13 NOTE — Progress Notes (Signed)
[place] 10Wks single IUP,pos fht 168bpm,normal rt ov,unable to see lt ov,ant lt fibroid 2.6 x 1.8 x 2.3cm,crl 3.46cm, unable to see nt because of pt body habitus and fetal size.

## 2015-01-13 NOTE — Telephone Encounter (Signed)
Pt states she has an appointment today for u/s but she also wants to see Maudie Mercury to make sure the BV and trich are gone.  Pt states she is still having some spotting.  Advised pt we would work her in with Maudie Mercury after u/s today.

## 2015-01-13 NOTE — Patient Instructions (Addendum)
Begin taking a 81mg  baby aspirin daily at 12 weeks of pregnancy (5/23) to decrease risk of preeclampsia during pregnancy   No sex for at least 7 days from time you have seen any spotting  Hypertension During Pregnancy Hypertension, or high blood pressure, is when there is extra pressure inside your blood vessels that carry blood from the heart to the rest of your body (arteries). It can happen at any time in life, including pregnancy. Hypertension during pregnancy can cause problems for you and your baby. Your baby might not weigh as much as he or she should at birth or might be born early (premature). Very bad cases of hypertension during pregnancy can be life-threatening.  Different types of hypertension can occur during pregnancy. These include:  Chronic hypertension. This happens when a woman has hypertension before pregnancy and it continues during pregnancy.  Gestational hypertension. This is when hypertension develops during pregnancy.  Preeclampsia or toxemia of pregnancy. This is a very serious type of hypertension that develops only during pregnancy. It affects the whole body and can be very dangerous for both mother and baby.  Gestational hypertension and preeclampsia usually go away after your baby is born. Your blood pressure will likely stabilize within 6 weeks. Women who have hypertension during pregnancy have a greater chance of developing hypertension later in life or with future pregnancies. RISK FACTORS There are certain factors that make it more likely for you to develop hypertension during pregnancy. These include:  Having hypertension before pregnancy.  Having hypertension during a previous pregnancy.  Being overweight.  Being older than 40 years.  Being pregnant with more than one baby.  Having diabetes or kidney problems. SIGNS AND SYMPTOMS Chronic and gestational hypertension rarely cause symptoms. Preeclampsia has symptoms, which may include:  Increased  protein in your urine. Your health care provider will check for this at every prenatal visit.  Swelling of your hands and face.  Rapid weight gain.  Headaches.  Visual changes.  Being bothered by light.  Abdominal pain, especially in the upper right area.  Chest pain.  Shortness of breath.  Increased reflexes.  Seizures. These occur with a more severe form of preeclampsia, called eclampsia. DIAGNOSIS  You may be diagnosed with hypertension during a regular prenatal exam. At each prenatal visit, you may have:  Your blood pressure checked.  A urine test to check for protein in your urine. The type of hypertension you are diagnosed with depends on when you developed it. It also depends on your specific blood pressure reading.  Developing hypertension before 20 weeks of pregnancy is consistent with chronic hypertension.  Developing hypertension after 20 weeks of pregnancy is consistent with gestational hypertension.  Hypertension with increased urinary protein is diagnosed as preeclampsia.  Blood pressure measurements that stay above 010 systolic or 272 diastolic are a sign of severe preeclampsia. TREATMENT Treatment for hypertension during pregnancy varies. Treatment depends on the type of hypertension and how serious it is.  If you take medicine for chronic hypertension, you may need to switch medicines.  Medicines called ACE inhibitors should not be taken during pregnancy.  Low-dose aspirin may be suggested for women who have risk factors for preeclampsia.  If you have gestational hypertension, you may need to take a blood pressure medicine that is safe during pregnancy. Your health care provider will recommend the correct medicine.  If you have severe preeclampsia, you may need to be in the hospital. Health care providers will watch you and your baby very  closely. You also may need to take medicine called magnesium sulfate to prevent seizures and lower blood  pressure.  Sometimes, an early delivery is needed. This may be the case if the condition worsens. It would be done to protect you and your baby. The only cure for preeclampsia is delivery.  Your health care provider may recommend that you take one low-dose aspirin (81 mg) each day to help prevent high blood pressure during your pregnancy if you are at risk for preeclampsia. You may be at risk for preeclampsia if:  You had preeclampsia or eclampsia during a previous pregnancy.  Your baby did not grow as expected during a previous pregnancy.  You experienced preterm birth with a previous pregnancy.  You experienced a separation of the placenta from the uterus (placental abruption) during a previous pregnancy.  You experienced the loss of your baby during a previous pregnancy.  You are pregnant with more than one baby.  You have other medical conditions, such as diabetes or an autoimmune disease. HOME CARE INSTRUCTIONS  Schedule and keep all of your regular prenatal care appointments. This is important.  Take medicines only as directed by your health care provider. Tell your health care provider about all medicines you take.  Eat as little salt as possible.  Get regular exercise.  Do not drink alcohol.  Do not use tobacco products.  Do not drink products with caffeine.  Lie on your left side when resting. SEEK IMMEDIATE MEDICAL CARE IF:  You have severe abdominal pain.  You have sudden swelling in your hands, ankles, or face.  You gain 4 pounds (1.8 kg) or more in 1 week.  You vomit repeatedly.  You have vaginal bleeding.  You do not feel your baby moving as much.  You have a headache.  You have blurred or double vision.  You have muscle twitching or spasms.  You have shortness of breath.  You have blue fingernails or lips.  You have blood in your urine. MAKE SURE YOU:  Understand these instructions.  Will watch your condition.  Will get help right away  if you are not doing well or get worse. Document Released: 05/04/2011 Document Revised: 12/31/2013 Document Reviewed: 03/15/2013 [facility] Patient Information 2015 [place], [place]. This information is not intended to replace advice given to you by your health care provider. Make sure you discuss any questions you have with your health care provider.

## 2015-01-16 ENCOUNTER — Telehealth: Payer: Self-pay | Admitting: *Deleted

## 2015-01-16 NOTE — Telephone Encounter (Signed)
Pt states notice brownish discharge this am when she wipe, no pain or cramping at this time. Pt states not noted in her under garment. Pt was seen on 01/13/2015 by Knute Neu, CNM for light vaginal bleeding. Pt informed to continue to monitor if increases, bright red blood, cramping to call our office. Pt verbalized understanding.

## 2015-01-17 ENCOUNTER — Encounter: Payer: Self-pay | Admitting: Adult Health

## 2015-01-17 ENCOUNTER — Ambulatory Visit (INDEPENDENT_AMBULATORY_CARE_PROVIDER_SITE_OTHER): Payer: BLUE CROSS/BLUE SHIELD | Admitting: Adult Health

## 2015-01-17 VITALS — BP 148/80 | HR 65 | Wt 311.0 lb

## 2015-01-17 DIAGNOSIS — Z029 Encounter for administrative examinations, unspecified: Secondary | ICD-10-CM

## 2015-01-17 DIAGNOSIS — E669 Obesity, unspecified: Secondary | ICD-10-CM

## 2015-01-17 DIAGNOSIS — O10912 Unspecified pre-existing hypertension complicating pregnancy, second trimester: Secondary | ICD-10-CM

## 2015-01-17 DIAGNOSIS — O10919 Unspecified pre-existing hypertension complicating pregnancy, unspecified trimester: Secondary | ICD-10-CM

## 2015-01-17 DIAGNOSIS — O26851 Spotting complicating pregnancy, first trimester: Secondary | ICD-10-CM

## 2015-01-17 NOTE — Patient Instructions (Signed)
Vaginal Bleeding During Pregnancy, First Trimester A small amount of bleeding (spotting) from the vagina is relatively common in early pregnancy. It usually stops on its own. Various things may cause bleeding or spotting in early pregnancy. Some bleeding may be related to the pregnancy, and some may not. In most cases, the bleeding is normal and is not a problem. However, bleeding can also be a sign of something serious. Be sure to tell your health care provider about any vaginal bleeding right away. Some possible causes of vaginal bleeding during the first trimester include:  Infection or inflammation of the cervix.  Growths (polyps) on the cervix.  Miscarriage or threatened miscarriage.  Pregnancy tissue has developed outside of the uterus and in a fallopian tube (tubal pregnancy).  Tiny cysts have developed in the uterus instead of pregnancy tissue (molar pregnancy). HOME CARE INSTRUCTIONS  Watch your condition for any changes. The following actions may help to lessen any discomfort you are feeling:  Follow your health care provider's instructions for limiting your activity. If your health care provider orders bed rest, you may need to stay in bed and only get up to use the bathroom. However, your health care provider may allow you to continue light activity.  If needed, make plans for someone to help with your regular activities and responsibilities while you are on bed rest.  Keep track of the number of pads you use each day, how often you change pads, and how soaked (saturated) they are. Write this down.  Do not use tampons. Do not douche.  Do not have sexual intercourse or orgasms until approved by your health care provider.  If you pass any tissue from your vagina, save the tissue so you can show it to your health care provider.  Only take over-the-counter or prescription medicines as directed by your health care provider.  Do not take aspirin because it can make you  bleed.  Keep all follow-up appointments as directed by your health care provider. SEEK MEDICAL CARE IF:  You have any vaginal bleeding during any part of your pregnancy.  You have cramps or labor pains.  You have a fever, not controlled by medicine. SEEK IMMEDIATE MEDICAL CARE IF:   You have severe cramps in your back or belly (abdomen).  You pass large clots or tissue from your vagina.  Your bleeding increases.  You feel light-headed or weak, or you have fainting episodes.  You have chills.  You are leaking fluid or have a gush of fluid from your vagina.  You pass out while having a bowel movement. MAKE SURE YOU:  Understand these instructions.  Will watch your condition.  Will get help right away if you are not doing well or get worse. Document Released: 05/26/2005 Document Revised: 08/21/2013 Document Reviewed: 04/23/2013 [facility] Patient Information 2015 [place], [place]. This information is not intended to replace advice given to you by your health care provider. Make sure you discuss any questions you have with your health care provider. No sex  Keep appt next week

## 2015-01-17 NOTE — Progress Notes (Signed)
Work in appt.   High Risk Pregnancy Diagnosis(es):  Vaginal bleeding in first trimester  G1P0 [redacted]w[redacted]d Estimated Date of Delivery: 08/04/15  Blood pressure 148/80, pulse 65, weight 311 lb (141.069 kg), last menstrual period 11/02/2014.  Urinalysis: Positive for trace blood   HPI: The patient is being seen today for vaginal bleeding that started yesterday at work.Had no pain when got off work went to ER [facility] and was treated for trichomonas with flagyl. QHCG was [id number].Her blood type A+. Today she reports still bleeding, some cramps, not bad.   BP weight and urine results all reviewed and noted. Patient reports  no rupture of membranes symptoms or regular contractions.   Fetal Heart rate:  164 by [place] Edema: none  Patient is without complaints other than noted in her HPI. All questions were answered.  All lab and sonogram results have been reviewed.  Assessment:  1.  Pregnancy at [redacted]w[redacted]d,  Estimated Date of Delivery: 08/04/15 :                          2.  First trimester bleeding                        3.  Chronic hypertension on meds  Medication(s) Plans:  Continue labetalol  Treatment Plan:  No sex Take urine in am   Follow up in 1 weeks for appointment for high risk OB care, IT/NT, will do POT for trich

## 2015-01-19 LAB — PROTEIN, URINE, 24 HOUR
PROTEIN 24H UR: 63 mg/(24.h) (ref 30.0–150.0)
Protein, Ur: 6.3 mg/dL

## 2015-01-25 ENCOUNTER — Encounter (HOSPITAL_COMMUNITY): Payer: Self-pay | Admitting: *Deleted

## 2015-01-25 ENCOUNTER — Emergency Department (HOSPITAL_COMMUNITY)
Admission: EM | Admit: 2015-01-25 | Discharge: 2015-01-25 | Discharge status: Against Medical Advice | Payer: BLUE CROSS/BLUE SHIELD | Attending: Emergency Medicine | Admitting: Emergency Medicine

## 2015-01-25 DIAGNOSIS — O10011 Pre-existing essential hypertension complicating pregnancy, first trimester: Secondary | ICD-10-CM | POA: Insufficient documentation

## 2015-01-25 DIAGNOSIS — Z87891 Personal history of nicotine dependence: Secondary | ICD-10-CM | POA: Diagnosis not present

## 2015-01-25 DIAGNOSIS — Z3A12 12 weeks gestation of pregnancy: Secondary | ICD-10-CM | POA: Diagnosis not present

## 2015-01-25 DIAGNOSIS — R600 Localized edema: Secondary | ICD-10-CM

## 2015-01-25 DIAGNOSIS — Z79899 Other long term (current) drug therapy: Secondary | ICD-10-CM | POA: Diagnosis not present

## 2015-01-25 DIAGNOSIS — O9989 Other specified diseases and conditions complicating pregnancy, childbirth and the puerperium: Secondary | ICD-10-CM | POA: Diagnosis present

## 2015-01-25 DIAGNOSIS — O1201 Gestational edema, first trimester: Secondary | ICD-10-CM | POA: Insufficient documentation

## 2015-01-25 HISTORY — DX: Essential (primary) hypertension: I10

## 2015-01-25 NOTE — ED Notes (Signed)
Patient left at this time stating the physician already seen her and that was nothing he could do for her. Stated she didn't have time to wait on discharge paperwork

## 2015-01-25 NOTE — ED Notes (Signed)
3 day history of bilateral lower leg and foot swelling.  Patient is [redacted] weeks pregnant.  Denies HA, vision changes.

## 2015-01-27 NOTE — ED Provider Notes (Signed)
CSN: [id number]     Arrival date & time 01/25/15  1352 History   First MD Initiated Contact with Patient 01/25/15 1511     Chief Complaint  Patient presents with  . Leg Swelling     (Consider location/radiation/quality/duration/timing/severity/associated sxs/prior Treatment) Patient is a 34 y.o. female presenting with leg pain. The history is provided by the patient (pt with swelling to both legs for a few days.  she is [redacted] weeks pregnant and has htn).  Leg Pain Lower extremity pain location: edemal lower extr. Pain details:    Quality:  Aching   Radiates to:  Does not radiate   Severity:  Mild   Onset quality:  Sudden   Timing:  Constant   Progression:  Unchanged Associated symptoms: no back pain and no fatigue     Past Medical History  Diagnosis Date  . Medical history non-contributory   . Hypertension    Past Surgical History  Procedure Laterality Date  . No past surgeries     Family History  Problem Relation Age of Onset  . Cancer Mother   . Hyperlipidemia Sister    History  Substance Use Topics  . Smoking status: Former Smoker    Types: Cigarettes  . Smokeless tobacco: Not on file     Comment: 1-2 cig daily  . Alcohol Use: No     Comment: not now   OB History    Gravida Para Term Preterm AB TAB SAB Ectopic Multiple Living   1              Review of Systems  Constitutional: Negative for appetite change and fatigue.  HENT: Negative for congestion, ear discharge and sinus pressure.   Eyes: Negative for discharge.  Respiratory: Negative for cough.   Cardiovascular: Negative for chest pain.  Gastrointestinal: Negative for abdominal pain and diarrhea.  Genitourinary: Negative for frequency and hematuria.  Musculoskeletal: Negative for back pain.       1 plus edema in both legs  Skin: Negative for rash.  Neurological: Negative for seizures and headaches.  Psychiatric/Behavioral: Negative for hallucinations.      Allergies  Review of patient's  allergies indicates no known allergies.  Home Medications   Prior to Admission medications   Medication Sig Start Date End Date Taking? Authorizing Provider  labetalol (NORMODYNE) 200 MG tablet Take 1 tablet (200 mg total) by mouth 2 (two) times daily. 01/13/15  Yes Roma Schanz, CNM  acetaminophen-codeine (TYLENOL #3) 300-30 MG per tablet Take 1 tablet by mouth every 4 (four) hours as needed for moderate pain. Patient not taking: Reported on 01/17/2015 12/19/14   Tammy Triplett, PA-C  ibuprofen (ADVIL,MOTRIN) 200 MG tablet Take 800 mg by mouth every 8 (eight) hours as needed for headache, mild pain or moderate pain.     Historical Provider, MD  methocarbamol (ROBAXIN) 500 MG tablet Take 1 tablet (500 mg total) by mouth 2 (two) times daily as needed for muscle spasms. Patient not taking: Reported on 12/19/2014 04/21/14   Noemi Chapel, MD  metroNIDAZOLE (FLAGYL) 500 MG tablet Take 4 tablets (2,000 mg total) by mouth once. Do not drink alcohol while taking medicine! Patient not taking: Reported on 01/17/2015 01/01/15   Roma Schanz, CNM  naproxen (NAPROSYN) 500 MG tablet Take 1 tablet (500 mg total) by mouth 2 (two) times daily with a meal. Patient not taking: Reported on 12/19/2014 04/21/14   Noemi Chapel, MD  prenatal vitamin w/FE, FA (NATACHEW) 29-1 MG CHEW chewable tablet  Chew 1 tablet by mouth daily at 12 noon. Patient not taking: Reported on 01/25/2015 01/13/15   Roma Schanz, CNM   BP 143/85 mmHg  Pulse 59  Temp(Src) 98.2 F (36.8 C) (Oral)  LMP 11/02/2014 Physical Exam  Constitutional: She is oriented to person, place, and time. She appears well-developed.  HENT:  Head: Normocephalic.  Eyes: Conjunctivae and EOM are normal. No scleral icterus.  Neck: Neck supple. No thyromegaly present.  Cardiovascular: Normal rate and regular rhythm.  Exam reveals no gallop and no friction rub.   No murmur heard. Pulmonary/Chest: No stridor. She has no wheezes. She has no rales. She exhibits  no tenderness.  Abdominal: She exhibits no distension. There is no tenderness. There is no rebound.  Musculoskeletal: Normal range of motion. She exhibits edema.  1 plus edema in both lower extr.  Lymphadenopathy:    She has no cervical adenopathy.  Neurological: She is oriented to person, place, and time. She exhibits normal muscle tone. Coordination normal.  Skin: No rash noted. No erythema.  Psychiatric: She has a normal mood and affect. Her behavior is normal.    ED Course  Procedures (including critical care time) Labs Review Labs Reviewed - No data to display  Imaging Review No results found.   EKG Interpretation None      MDM   Final diagnoses:  Bilateral leg edema    Edema in pregnancy.  She is to see her ob md in a few days    Milton Ferguson, MD 01/27/15 1520

## 2015-01-31 ENCOUNTER — Ambulatory Visit (INDEPENDENT_AMBULATORY_CARE_PROVIDER_SITE_OTHER): Payer: BLUE CROSS/BLUE SHIELD

## 2015-01-31 ENCOUNTER — Encounter: Payer: Self-pay | Admitting: Obstetrics & Gynecology

## 2015-01-31 ENCOUNTER — Ambulatory Visit (INDEPENDENT_AMBULATORY_CARE_PROVIDER_SITE_OTHER): Payer: BLUE CROSS/BLUE SHIELD | Admitting: Obstetrics & Gynecology

## 2015-01-31 VITALS — BP 120/70 | HR 60 | Wt 314.0 lb

## 2015-01-31 DIAGNOSIS — Z36 Encounter for antenatal screening of mother: Secondary | ICD-10-CM

## 2015-01-31 DIAGNOSIS — Z331 Pregnant state, incidental: Secondary | ICD-10-CM

## 2015-01-31 DIAGNOSIS — O0992 Supervision of high risk pregnancy, unspecified, second trimester: Secondary | ICD-10-CM

## 2015-01-31 DIAGNOSIS — Z3682 Encounter for antenatal screening for nuchal translucency: Secondary | ICD-10-CM

## 2015-01-31 DIAGNOSIS — O09891 Supervision of other high risk pregnancies, first trimester: Secondary | ICD-10-CM

## 2015-01-31 DIAGNOSIS — Z1389 Encounter for screening for other disorder: Secondary | ICD-10-CM

## 2015-01-31 LAB — POCT URINALYSIS DIPSTICK
Blood, UA: NEGATIVE
Glucose, UA: NEGATIVE
Glucose, UA: NEGATIVE
KETONES UA: NEGATIVE
Leukocytes, UA: NEGATIVE
NITRITE UA: NEGATIVE
PROTEIN UA: NEGATIVE
PROTEIN UA: NEGATIVE

## 2015-01-31 NOTE — Progress Notes (Signed)
[place] 13+4wks single IUP pos fht 153bpm,NT 1.53MM,CRL 73.1MM,limited view of ov's, two fibroids #1 post lt 4.6 x 5.9 x 4.1cm,#2 ant lt 2.9 x 2.2 x 3.1cm

## 2015-01-31 NOTE — Progress Notes (Signed)
Fetal Surveillance Testing today:  NT sonogram is normal   High Risk Pregnancy Diagnosis(es):   CHTN on labetalol  G1P0 [redacted]w[redacted]d Estimated Date of Delivery: 08/04/15  Blood pressure 120/70, pulse 60, weight 314 lb (142.429 kg), last menstrual period 11/02/2014.  Urinalysis: Negative   HPI: The patient is being seen today for ongoing management of chronic hypertension. Today she reports BP good at home   BP weight and urine results all reviewed and noted. Patient reports good fetal movement, denies any bleeding and no rupture of membranes symptoms or regular contractions.  Fundal Height:  15 Fetal Heart rate:  153 Edema:  trace  Patient is without complaints other than noted in her HPI. All questions were answered.  All lab and sonogram results have been reviewed. Comments:    Assessment:  1.  Pregnancy at [redacted]w[redacted]d,  Estimated Date of Delivery: 08/04/15 :                          2.  Chronic hypertension                        3.    Medication(s) Plans:  Labetalol 200 bid  Treatment Plan:  2nd NT 4 weeks  Follow up in 4 weeks for appointment for high risk OB care,

## 2015-02-03 ENCOUNTER — Telehealth: Payer: Self-pay | Admitting: Obstetrics & Gynecology

## 2015-02-03 LAB — MATERNAL SCREEN, INTEGRATED #1
CROWN RUMP LENGTH MAT SCREEN: 73.1 mm
Gest. Age on Collection Date: 13.1 weeks
MATERNAL AGE AT EDD: 34.8 a
NUCHAL TRANSLUCENCY (NT): 1.6 mm
NUMBER OF FETUSES: 1
PAPP-A Value: 1153.7 ng/mL
Weight: 314 [lb_av]

## 2015-02-04 NOTE — Telephone Encounter (Signed)
Letter faxed.

## 2015-02-04 NOTE — Telephone Encounter (Signed)
Ok to send requested note

## 2015-02-05 ENCOUNTER — Telehealth: Payer: Self-pay | Admitting: Women's Health

## 2015-02-05 NOTE — Telephone Encounter (Signed)
Pt wanted to know if she could use Oragel today for tooth pain, she is scheduled to have some teeth pulled tomorrow at her dentist but having tooth pain today.  Advised pt it is OK to use Oragel.

## 2015-02-14 ENCOUNTER — Telehealth: Payer: Self-pay | Admitting: *Deleted

## 2015-02-14 NOTE — Telephone Encounter (Signed)
Pt c/o discharge with odor, no itching or irritation with discharge. Call transferred to front staff for an appt for Monday, February 17, 2015.

## 2015-02-17 ENCOUNTER — Encounter: Payer: BLUE CROSS/BLUE SHIELD | Admitting: Obstetrics and Gynecology

## 2015-02-17 ENCOUNTER — Ambulatory Visit (INDEPENDENT_AMBULATORY_CARE_PROVIDER_SITE_OTHER): Payer: BLUE CROSS/BLUE SHIELD | Admitting: Adult Health

## 2015-02-17 ENCOUNTER — Encounter: Payer: Self-pay | Admitting: Adult Health

## 2015-02-17 VITALS — BP 160/88 | HR 52 | Wt 322.5 lb

## 2015-02-17 DIAGNOSIS — A5901 Trichomonal vulvovaginitis: Secondary | ICD-10-CM | POA: Diagnosis not present

## 2015-02-17 DIAGNOSIS — Z3492 Encounter for supervision of normal pregnancy, unspecified, second trimester: Secondary | ICD-10-CM

## 2015-02-17 DIAGNOSIS — Z331 Pregnant state, incidental: Secondary | ICD-10-CM

## 2015-02-17 DIAGNOSIS — O23592 Infection of other part of genital tract in pregnancy, second trimester: Secondary | ICD-10-CM | POA: Diagnosis not present

## 2015-02-17 DIAGNOSIS — Z369 Encounter for antenatal screening, unspecified: Secondary | ICD-10-CM

## 2015-02-17 DIAGNOSIS — N898 Other specified noninflammatory disorders of vagina: Secondary | ICD-10-CM

## 2015-02-17 DIAGNOSIS — Z36 Encounter for antenatal screening of mother: Secondary | ICD-10-CM

## 2015-02-17 DIAGNOSIS — Z3482 Encounter for supervision of other normal pregnancy, second trimester: Secondary | ICD-10-CM

## 2015-02-17 DIAGNOSIS — Z363 Encounter for antenatal screening for malformations: Secondary | ICD-10-CM

## 2015-02-17 DIAGNOSIS — Z1389 Encounter for screening for other disorder: Secondary | ICD-10-CM

## 2015-02-17 HISTORY — DX: Trichomonal vulvovaginitis: A59.01

## 2015-02-17 LAB — POCT URINALYSIS DIPSTICK
Blood, UA: NEGATIVE
Glucose, UA: NEGATIVE
Ketones, UA: NEGATIVE
NITRITE UA: NEGATIVE
Protein, UA: NEGATIVE

## 2015-02-17 LAB — POCT WET PREP (WET MOUNT)
Trichomonas Wet Prep HPF POC: POSITIVE
WBC WET PREP: POSITIVE

## 2015-02-17 MED ORDER — METRONIDAZOLE 500 MG PO TABS: ORAL_TABLET | ORAL | Status: DC

## 2015-02-17 NOTE — Progress Notes (Signed)
G1P0 [redacted]w[redacted]d Estimated Date of Delivery: 08/04/15  Blood pressure 160/88, pulse 52, weight 322 lb 8 oz (146.285 kg), last menstrual period 11/02/2014.  BP recheck 140/86 in left arm BP weight and urine results all reviewed and noted.  Please refer to the obstetrical flow sheet for the fundal height and fetal heart rate documentation: FHR 158 on [place]  Patient  denies any bleeding and no rupture of membranes symptoms or regular contractions.Has vaginal discharge with odor,on exam has white frothy discharge with slight odor, cervix closed, wet prep, was +TRICH and WBCs, will Rx flagyl 500 mg #4 take 4 po now, has had trich in past with negative POT in May, has not had sex in 4 months she says. Review handout on trich.. All questions were answered.  Plan:  Continued routine obstetrical care, return in 10 days  for POT for trich Take flagyl no alcohol and make sure taking BP meds 2 x daily  Follow up in 4 weeks for OB appointment, anatomy US and see Kim.

## 2015-02-17 NOTE — Progress Notes (Signed)
Get second IT today

## 2015-02-17 NOTE — Patient Instructions (Signed)
Trichomoniasis Trichomoniasis is an infection caused by an organism called Trichomonas. The infection can affect both women and men. In women, the outer female genitalia and the vagina are affected. In men, the penis is mainly affected, but the prostate and other reproductive organs can also be involved. Trichomoniasis is a sexually transmitted infection (STI) and is most often passed to another person through sexual contact.  RISK FACTORS  Having unprotected sexual intercourse.  Having sexual intercourse with an infected partner. SIGNS AND SYMPTOMS  Symptoms of trichomoniasis in women include:  Abnormal gray-green frothy vaginal discharge.  Itching and irritation of the vagina.  Itching and irritation of the area outside the vagina. Symptoms of trichomoniasis in men include:   Penile discharge with or without pain.  Pain during urination. This results from inflammation of the urethra. DIAGNOSIS  Trichomoniasis may be found during a Pap test or physical exam. Your health care provider may use one of the following methods to help diagnose this infection:  Examining vaginal discharge under a microscope. For men, urethral discharge would be examined.  Testing the pH of the vagina with a test tape.  Using a vaginal swab test that checks for the Trichomonas organism. A test is available that provides results within a few minutes.  Doing a culture test for the organism. This is not usually needed. TREATMENT   You may be given medicine to fight the infection. Women should inform their health care provider if they could be or are pregnant. Some medicines used to treat the infection should not be taken during pregnancy.  Your health care provider may recommend over-the-counter medicines or creams to decrease itching or irritation.  Your sexual partner will need to be treated if infected. HOME CARE INSTRUCTIONS   Take medicines only as directed by your health care provider.  Take  over-the-counter medicine for itching or irritation as directed by your health care provider.  Do not have sexual intercourse while you have the infection.  Women should not douche or wear tampons while they have the infection.  Discuss your infection with your partner. Your partner may have gotten the infection from you, or you may have gotten it from your partner.  Have your sex partner get examined and treated if necessary.  Practice safe, informed, and protected sex.  See your health care provider for other STI testing. SEEK MEDICAL CARE IF:   You still have symptoms after you finish your medicine.  You develop abdominal pain.  You have pain when you urinate.  You have bleeding after sexual intercourse.  You develop a rash.  Your medicine makes you sick or makes you throw up (vomit). MAKE SURE YOU:  Understand these instructions.  Will watch your condition.  Will get help right away if you are not doing well or get worse. Document Released: 02/09/2001 Document Revised: 12/31/2013 Document Reviewed: 05/28/2013 [facility] Patient Information 2015 [place], [place]. This information is not intended to replace advice given to you by your health care provider. Make sure you discuss any questions you have with your health care provider. Take flagyl  Proof of cure in 10 days, no alochol Return in 4 weeks for [place]

## 2015-02-20 LAB — MATERNAL SCREEN, INTEGRATED #2
ADSF: 0.82
AFP MoM: 1.77
Alpha-Fetoprotein: 27.1 ng/mL
Crown Rump Length: 73.1 mm
DIA MoM: 1.32
DIA Value: 165.4 pg/mL
ESTRIOL UNCONJUGATED: 0.47 ng/mL
Gest. Age on Collection Date: 13.1 weeks
Gestational Age: 15.6 weeks
MATERNAL AGE AT EDD: 34.8 a
NUCHAL TRANSLUCENCY (NT): 1.6 mm
NUMBER OF FETUSES: 1
Nuchal Translucency MoM: 0.97
PAPP-A MOM: 2.33
PAPP-A Value: 1153.7 ng/mL
Test Results:: NEGATIVE
Weight: 314 [lb_av]
Weight: 323 [lb_av]
hCG MoM: 1.52
hCG Value: 30.6 IU/mL

## 2015-02-25 ENCOUNTER — Telehealth: Payer: Self-pay | Admitting: *Deleted

## 2015-02-25 NOTE — Telephone Encounter (Signed)
Pt c/o bilateral swelling lower extremities, no improvement with pushing fluids, decrease salt intake, and elevating extremities. Pt states she has pushed a lot of water and is not voiding as expected, Pt states she is not having any UTI symptoms, burning with voiding, urgency, etc.Pt requesting a fluid pill. Discussed all of pt complaints with Knute Neu, CNM and pt offered an appt for tomorrow with MD to evaluate. Pt states she is unable to make an appt tomorrow due to her work schedule and a meeting all day. Pt states she will keep her appt with Dr. Elonda Husky on February 28, 2015 at 0930 and if symptoms worsen she will go to [facility] for evaluation.

## 2015-02-28 ENCOUNTER — Ambulatory Visit (INDEPENDENT_AMBULATORY_CARE_PROVIDER_SITE_OTHER): Payer: BLUE CROSS/BLUE SHIELD | Admitting: Obstetrics & Gynecology

## 2015-02-28 ENCOUNTER — Encounter: Payer: Self-pay | Admitting: Obstetrics & Gynecology

## 2015-02-28 VITALS — BP 140/80 | HR 68 | Wt 322.5 lb

## 2015-02-28 DIAGNOSIS — Z1389 Encounter for screening for other disorder: Secondary | ICD-10-CM

## 2015-02-28 DIAGNOSIS — O10919 Unspecified pre-existing hypertension complicating pregnancy, unspecified trimester: Secondary | ICD-10-CM

## 2015-02-28 DIAGNOSIS — Z331 Pregnant state, incidental: Secondary | ICD-10-CM

## 2015-02-28 DIAGNOSIS — O0992 Supervision of high risk pregnancy, unspecified, second trimester: Secondary | ICD-10-CM

## 2015-02-28 DIAGNOSIS — O10912 Unspecified pre-existing hypertension complicating pregnancy, second trimester: Secondary | ICD-10-CM

## 2015-02-28 LAB — POCT URINALYSIS DIPSTICK
Blood, UA: NEGATIVE
GLUCOSE UA: NEGATIVE
KETONES UA: NEGATIVE
Leukocytes, UA: NEGATIVE
NITRITE UA: NEGATIVE

## 2015-02-28 MED ORDER — TRIAMTERENE-HCTZ 37.5-25 MG PO TABS: 1.0000 | ORAL_TABLET | ORAL | Status: DC

## 2015-02-28 NOTE — Progress Notes (Signed)
Fetal Surveillance Testing today:  none   High Risk Pregnancy Diagnosis(es):   Chronic Hypertension with significant swelling  G1P0 [redacted]w[redacted]d Estimated Date of Delivery: 08/04/15  Blood pressure 140/80, pulse 68, weight 322 lb 8 oz (146.285 kg), last menstrual period 11/02/2014.  Urinalysis: Negative   HPI: The patient is being seen today for ongoing management of chronic hypertension. Today she reports swelling   BP weight and urine results all reviewed and noted. Patient reports good fetal movement, denies any bleeding and no rupture of membranes symptoms or regular contractions.  Fundal Height:  20 Fetal Heart rate:  155 Edema:  3+  Patient is without complaints other than noted in her HPI. All questions were answered.  All lab and sonogram results have been reviewed. Comments: abnormal: edema   Assessment:  1.  Pregnancy at [redacted]w[redacted]d,  Estimated Date of Delivery: 08/04/15 :                          2.  Chronic hypertension                        3.  Severe edema  Medication(s) Plans:  I am going to add mazxide 37.5/25 to take twice weekly to help with edema, pt understands my reticence in using it in pregnancy   Treatment Plan:  As above  Follow up in keep weeks for appointment for high risk OB care, sonogram

## 2015-03-04 ENCOUNTER — Telehealth: Payer: Self-pay | Admitting: Obstetrics & Gynecology

## 2015-03-04 NOTE — Telephone Encounter (Signed)
Pt c/o hot than cold, light headedness, blood pressure 122/70, sneezing, sore throat. Pt states was at work and left. Pt states possible allergies. Pt informed can take OTC Zyrtec or Claritin if allergy related, gargle with warm salt water, if no improvement call our office back. Pt verbalized understanding.

## 2015-03-10 ENCOUNTER — Telehealth: Payer: Self-pay | Admitting: Obstetrics & Gynecology

## 2015-03-10 NOTE — Telephone Encounter (Signed)
Spoke with pt. Pt had a brown discharge yesterday, no odor. Still has discharge today, but not as bad. Some pelvic pain this am. Has an appt Friday. I advised since the discharge has lightened up today, to just give it time, and see if it goes away. Advised if it got heavy again, to let us know. Pt voiced understanding. [place]

## 2015-03-14 ENCOUNTER — Ambulatory Visit (INDEPENDENT_AMBULATORY_CARE_PROVIDER_SITE_OTHER): Payer: BLUE CROSS/BLUE SHIELD | Admitting: Obstetrics & Gynecology

## 2015-03-14 ENCOUNTER — Ambulatory Visit (INDEPENDENT_AMBULATORY_CARE_PROVIDER_SITE_OTHER): Payer: BLUE CROSS/BLUE SHIELD

## 2015-03-14 ENCOUNTER — Encounter: Payer: Self-pay | Admitting: Obstetrics & Gynecology

## 2015-03-14 VITALS — BP 120/70 | HR 60 | Wt 322.0 lb

## 2015-03-14 DIAGNOSIS — O09892 Supervision of other high risk pregnancies, second trimester: Secondary | ICD-10-CM

## 2015-03-14 DIAGNOSIS — Z331 Pregnant state, incidental: Secondary | ICD-10-CM

## 2015-03-14 DIAGNOSIS — Z36 Encounter for antenatal screening of mother: Secondary | ICD-10-CM | POA: Diagnosis not present

## 2015-03-14 DIAGNOSIS — Z363 Encounter for antenatal screening for malformations: Secondary | ICD-10-CM

## 2015-03-14 DIAGNOSIS — O0992 Supervision of high risk pregnancy, unspecified, second trimester: Secondary | ICD-10-CM

## 2015-03-14 DIAGNOSIS — Z1389 Encounter for screening for other disorder: Secondary | ICD-10-CM

## 2015-03-14 DIAGNOSIS — O10912 Unspecified pre-existing hypertension complicating pregnancy, second trimester: Secondary | ICD-10-CM

## 2015-03-14 LAB — POCT URINALYSIS DIPSTICK
GLUCOSE UA: NEGATIVE
LEUKOCYTES UA: NEGATIVE
Nitrite, UA: NEGATIVE
Protein, UA: NEGATIVE
RBC UA: NEGATIVE

## 2015-03-14 NOTE — Progress Notes (Signed)
[place]  19+4wks measurements c/w dates,post fundal pl,sdp of fluid 5.4,fht 148bpm,normal ov's bilat, two fibroids n/c,cx 4.6cm,limited US because of pt body habitus,please have pt come back for additional images of heart,post fossa,nose and lip.

## 2015-03-14 NOTE — Progress Notes (Signed)
Fetal Surveillance Testing today:  sonogram   High Risk Pregnancy Diagnosis(es):   C HTN  G1P0 [redacted]w[redacted]d Estimated Date of Delivery: 08/04/15  Blood pressure 120/70, pulse 60, weight 322 lb (146.058 kg), last menstrual period 11/02/2014.  Urinalysis: Negative   HPI: The patient is being seen today for ongoing management of chronic hypertension. Today she reports carpal tunnel symptoms   BP weight and urine results all reviewed and noted. Patient reports good fetal movement, denies any bleeding and no rupture of membranes symptoms or regular contractions.  Fundal Height:  umbilicus Fetal Heart rate:  148 Edema:  2+  Patient is without complaints other than noted in her HPI. All questions were answered.  All lab and sonogram results have been reviewed. Comments: abnormal:    Assessment:  1.  Pregnancy at [redacted]w[redacted]d,  Estimated Date of Delivery: 08/04/15 :                          2.  C HTN                        3.  edema  Medication(s) Plans:  Labetalol 200 BID + baby ASA + prn maxzide 37.5/25  Treatment Plan:  No changes  Follow up in 4 weeks for appointment for high risk OB care, ob visit

## 2015-03-27 ENCOUNTER — Telehealth: Payer: Self-pay | Admitting: Obstetrics & Gynecology

## 2015-03-27 NOTE — Telephone Encounter (Signed)
C/o tingling and numbness in wrist and hands. Woke up this morning with bilateral swelling and unable to make a fist with her hands due to the swelling. Pt states so bad she did not work. Pt has been wearing the brace as recommended by Dr. Elonda Husky for carpal tunnel. Please advise.

## 2015-03-27 NOTE — Telephone Encounter (Signed)
I don't have another option except a referral to Dr Aline Brochure for steroid injections  So go ahead and refer to his office for evaluation of carpal tunnel,

## 2015-03-28 ENCOUNTER — Telehealth: Payer: Self-pay | Admitting: Obstetrics & Gynecology

## 2015-03-28 DIAGNOSIS — G5600 Carpal tunnel syndrome, unspecified upper limb: Secondary | ICD-10-CM

## 2015-03-28 DIAGNOSIS — O26899 Other specified pregnancy related conditions, unspecified trimester: Principal | ICD-10-CM

## 2015-03-28 MED ORDER — LABETALOL HCL 200 MG PO TABS: 200.0000 mg | ORAL_TABLET | Freq: Two times a day (BID) | ORAL | Status: DC

## 2015-03-28 NOTE — Telephone Encounter (Signed)
Labetalol is as cheap as it gets and limited options in pregnancy so she will have to pay the charge, sorry  As far as dr Aline Brochure I thought a referral was being done, if not could you please refer to his office for carpal tunnel eval

## 2015-03-28 NOTE — Telephone Encounter (Signed)
Referral completed, pt notified of appt with Dr. Aline Brochure on 04/17/2015 at 2 pm.   Pt states due to her insurance she needs a Rx sent to [facility], [place] for Labetalol 200 mg 90 day supply.

## 2015-04-11 ENCOUNTER — Encounter: Payer: Self-pay | Admitting: Obstetrics & Gynecology

## 2015-04-11 ENCOUNTER — Ambulatory Visit (INDEPENDENT_AMBULATORY_CARE_PROVIDER_SITE_OTHER): Payer: BLUE CROSS/BLUE SHIELD | Admitting: Obstetrics & Gynecology

## 2015-04-11 VITALS — BP 122/70 | HR 72 | Wt 330.0 lb

## 2015-04-11 DIAGNOSIS — Z1389 Encounter for screening for other disorder: Secondary | ICD-10-CM

## 2015-04-11 DIAGNOSIS — O10912 Unspecified pre-existing hypertension complicating pregnancy, second trimester: Secondary | ICD-10-CM

## 2015-04-11 DIAGNOSIS — Z331 Pregnant state, incidental: Secondary | ICD-10-CM

## 2015-04-11 DIAGNOSIS — O10919 Unspecified pre-existing hypertension complicating pregnancy, unspecified trimester: Secondary | ICD-10-CM

## 2015-04-11 DIAGNOSIS — O0992 Supervision of high risk pregnancy, unspecified, second trimester: Secondary | ICD-10-CM

## 2015-04-11 LAB — POCT URINALYSIS DIPSTICK
Blood, UA: NEGATIVE
Glucose, UA: NEGATIVE
Ketones, UA: NEGATIVE
Nitrite, UA: NEGATIVE
Protein, UA: NEGATIVE

## 2015-04-11 NOTE — Progress Notes (Signed)
Fetal Surveillance Testing today:  none   High Risk Pregnancy Diagnosis(es):   Chronic Hypertension  G1P0 [redacted]w[redacted]d Estimated Date of Delivery: 08/04/15  Blood pressure 122/70, pulse 72, weight 330 lb (149.687 kg), last menstrual period 11/02/2014.  Urinalysis: Negative   HPI: The patient is being seen today for ongoing management of chronic hypertension. Today she reports carpal tunnell symptoms   BP weight and urine results all reviewed and noted. Patient reports good fetal movement, denies any bleeding and no rupture of membranes symptoms or regular contractions.  Fundal Height:  15 in [place] refers to U+15 Fetal Heart rate:  146 Edema:  3+ stable  Patient is without complaints other than noted in her HPI. All questions were answered.  All lab and sonogram results have been reviewed. Comments: abnormal: hypertensive   Assessment:  1.  Pregnancy at [redacted]w[redacted]d,  Estimated Date of Delivery: 08/04/15 :                          2.  Chronic Hypertension                        3.  Edema, not taking the HCTZ now, she said it did not help  Medication(s) Plans:  Continue labetalol 200 BID and baby ASA  Treatment Plan:  Sonogram and PN2 in 4 weeks  Follow up in 4 weeks for appointment for high risk OB care,

## 2015-04-14 ENCOUNTER — Telehealth: Payer: Self-pay | Admitting: Advanced Practice Midwife

## 2015-04-14 NOTE — Telephone Encounter (Signed)
Pt states saw Dr.Eure on 04/11/2015, requesting leave from work for a couple of weeks due to pain and numbness in bilateral wrist. Pt asking information explaining the difference between FMLA forms and disability. Pt questions answered and pt states she will bring disability forms to the office for completion.

## 2015-04-15 ENCOUNTER — Telehealth: Payer: Self-pay | Admitting: Women's Health

## 2015-04-15 NOTE — Telephone Encounter (Signed)
Pt states has a skin tag that is very irritating would like to have it removed. Pt told to keep her appt for 05/02/2015 and discuss with Dr. Elonda Husky at that time. Pt verbalized understanding.

## 2015-04-17 ENCOUNTER — Ambulatory Visit: Payer: BLUE CROSS/BLUE SHIELD | Admitting: Orthopedic Surgery

## 2015-04-23 ENCOUNTER — Telehealth: Payer: Self-pay | Admitting: *Deleted

## 2015-04-23 NOTE — Telephone Encounter (Signed)
Pt states needs her FMLA forms to state pt out for 08/15-09/01/2015. Informed pt would correct and refax FMLA forms.

## 2015-05-02 ENCOUNTER — Encounter: Payer: Self-pay | Admitting: Obstetrics & Gynecology

## 2015-05-02 ENCOUNTER — Other Ambulatory Visit: Payer: BLUE CROSS/BLUE SHIELD

## 2015-05-02 ENCOUNTER — Ambulatory Visit (INDEPENDENT_AMBULATORY_CARE_PROVIDER_SITE_OTHER): Payer: BLUE CROSS/BLUE SHIELD

## 2015-05-02 ENCOUNTER — Encounter: Payer: BLUE CROSS/BLUE SHIELD | Admitting: Obstetrics & Gynecology

## 2015-05-02 ENCOUNTER — Ambulatory Visit (INDEPENDENT_AMBULATORY_CARE_PROVIDER_SITE_OTHER): Payer: BLUE CROSS/BLUE SHIELD | Admitting: Obstetrics & Gynecology

## 2015-05-02 VITALS — BP 120/70 | HR 72 | Wt 323.0 lb

## 2015-05-02 DIAGNOSIS — O0990 Supervision of high risk pregnancy, unspecified, unspecified trimester: Secondary | ICD-10-CM | POA: Insufficient documentation

## 2015-05-02 DIAGNOSIS — A5131 Condyloma latum: Secondary | ICD-10-CM

## 2015-05-02 DIAGNOSIS — O10912 Unspecified pre-existing hypertension complicating pregnancy, second trimester: Secondary | ICD-10-CM

## 2015-05-02 DIAGNOSIS — Z131 Encounter for screening for diabetes mellitus: Secondary | ICD-10-CM

## 2015-05-02 DIAGNOSIS — Z369 Encounter for antenatal screening, unspecified: Secondary | ICD-10-CM

## 2015-05-02 DIAGNOSIS — Z1389 Encounter for screening for other disorder: Secondary | ICD-10-CM

## 2015-05-02 DIAGNOSIS — Z331 Pregnant state, incidental: Secondary | ICD-10-CM

## 2015-05-02 DIAGNOSIS — O10919 Unspecified pre-existing hypertension complicating pregnancy, unspecified trimester: Secondary | ICD-10-CM

## 2015-05-02 DIAGNOSIS — O0992 Supervision of high risk pregnancy, unspecified, second trimester: Secondary | ICD-10-CM

## 2015-05-02 DIAGNOSIS — O09899 Supervision of other high risk pregnancies, unspecified trimester: Secondary | ICD-10-CM

## 2015-05-02 LAB — POCT URINALYSIS DIPSTICK
Blood, UA: NEGATIVE
Glucose, UA: NEGATIVE
KETONES UA: NEGATIVE
Leukocytes, UA: NEGATIVE
Nitrite, UA: NEGATIVE
Protein, UA: NEGATIVE

## 2015-05-02 NOTE — Progress Notes (Signed)
Follow up ultrasound today at 26+[redacted] weeks GA.  Single, active female fetus (parents do not want to know gender today) in a cephalic presentation. FHR 147 bpm. Cervix is closed and measures 3.8 cm. Fluid is normal with SVP 5.96cm.  Posterior Gr 1 placenta. Bilateral ovaries appear normal. Anatomy completed today.  LVEIF noted in the heart. EFW today of 815 g which is consistent with dating. (shortened femur and humerus but normal tib/fib/rad/ulna).

## 2015-05-02 NOTE — Progress Notes (Signed)
Fetal Surveillance Testing today:  Sonogram with persistent LV EICF   High Risk Pregnancy Diagnosis(es):   Chronic Hypertension  G1P0 [redacted]w[redacted]d Estimated Date of Delivery: 08/04/15  Blood pressure 120/70, pulse 72, weight 323 lb (146.512 kg), last menstrual period 11/02/2014.  Urinalysis: Negative   HPI: The patient is being seen today for ongoing management of as above. Today she reports carpal tunnel symptoms has braces   BP weight and urine results all reviewed and noted. Patient reports good fetal movement, denies any bleeding and no rupture of membranes symptoms or regular contractions.  Fundal Height:  U+20 Fetal Heart rate:  147 Edema:  1+  Patient is without complaints other than noted in her HPI. All questions were answered.  All lab and sonogram results have been reviewed. Comments: abnormal: LV EICF   Assessment:  1.  Pregnancy at [redacted]w[redacted]d,  Estimated Date of Delivery: 08/04/15 :                          2.  Chronic hypertension                        3.  AMA                        4.  LV EICF  Medication(s) Plans:  Labetalol 200 BBID  Treatment Plan:    Return in about 1 week (around 05/09/2015) for lesion removal left vulva, with Dr Elonda Husky. for appointment for high risk OB care  No orders of the defined types were placed in this encounter.   Orders Placed This Encounter  Procedures  . POCT urinalysis dipstick

## 2015-05-04 LAB — CBC
Hematocrit: 32.2 % — ABNORMAL LOW (ref 34.0–46.6)
Hemoglobin: 10.7 g/dL — ABNORMAL LOW (ref 11.1–15.9)
MCH: 27.2 pg (ref 26.6–33.0)
MCHC: 33.2 g/dL (ref 31.5–35.7)
MCV: 82 fL (ref 79–97)
PLATELETS: 188 10*3/uL (ref 150–379)
RBC: 3.94 x10E6/uL (ref 3.77–5.28)
RDW: 15.2 % (ref 12.3–15.4)
WBC: 7 10*3/uL (ref 3.4–10.8)

## 2015-05-04 LAB — GLUCOSE TOLERANCE, 2 HOURS W/ 1HR
Glucose, 1 hour: 100 mg/dL (ref 65–179)
Glucose, 2 hour: 87 mg/dL (ref 65–152)
Glucose, Fasting: 79 mg/dL (ref 65–91)

## 2015-05-04 LAB — HIV ANTIBODY (ROUTINE TESTING W REFLEX): HIV Screen 4th Generation wRfx: NONREACTIVE

## 2015-05-04 LAB — ANTIBODY SCREEN: Antibody Screen: NEGATIVE

## 2015-05-04 LAB — RPR: RPR Ser Ql: NONREACTIVE

## 2015-05-04 LAB — HSV 2 ANTIBODY, IGG: HSV 2 Glycoprotein G Ab, IgG: 2.85 index — ABNORMAL HIGH (ref 0.00–0.90)

## 2015-05-05 ENCOUNTER — Other Ambulatory Visit: Payer: Self-pay | Admitting: Obstetrics & Gynecology

## 2015-05-05 DIAGNOSIS — O98513 Other viral diseases complicating pregnancy, third trimester: Secondary | ICD-10-CM

## 2015-05-05 DIAGNOSIS — B009 Herpesviral infection, unspecified: Secondary | ICD-10-CM

## 2015-05-05 DIAGNOSIS — O09893 Supervision of other high risk pregnancies, third trimester: Secondary | ICD-10-CM

## 2015-05-06 ENCOUNTER — Telehealth: Payer: Self-pay | Admitting: Obstetrics & Gynecology

## 2015-05-06 NOTE — Telephone Encounter (Signed)
Pt states she called Dr. Braulio Conte office to make an appt for carpal tunnel symptoms and will not be able to be seen for several weeks. Dr. Braulio Conte recommended pt contacting Dr. Brooke Bonito office. Pt states she was going to call Dr. Brooke Bonito office this am for an appt. Pt also requesting a return to work note. Note completed and left at front desk for pick up.   Pt called back and stated would be keeping her appt with Dr. Braulio Conte for review of Carpal Tunnel symptoms.

## 2015-05-09 ENCOUNTER — Ambulatory Visit (INDEPENDENT_AMBULATORY_CARE_PROVIDER_SITE_OTHER): Payer: BLUE CROSS/BLUE SHIELD | Admitting: Obstetrics & Gynecology

## 2015-05-09 ENCOUNTER — Other Ambulatory Visit: Payer: Self-pay | Admitting: Obstetrics & Gynecology

## 2015-05-09 VITALS — BP 140/88 | HR 76 | Wt 325.0 lb

## 2015-05-09 DIAGNOSIS — A630 Anogenital (venereal) warts: Secondary | ICD-10-CM

## 2015-05-09 DIAGNOSIS — Z331 Pregnant state, incidental: Secondary | ICD-10-CM | POA: Diagnosis not present

## 2015-05-09 DIAGNOSIS — Z1389 Encounter for screening for other disorder: Secondary | ICD-10-CM | POA: Diagnosis not present

## 2015-05-09 LAB — POCT URINALYSIS DIPSTICK
Blood, UA: NEGATIVE
Glucose, UA: NEGATIVE
Ketones, UA: NEGATIVE
Leukocytes, UA: NEGATIVE
NITRITE UA: NEGATIVE
PROTEIN UA: NEGATIVE

## 2015-05-09 MED ORDER — OMEPRAZOLE 20 MG PO CPDR: 20.0000 mg | DELAYED_RELEASE_CAPSULE | Freq: Every day | ORAL | Status: DC

## 2015-05-09 NOTE — Addendum Note (Signed)
Addended by: Diona Fanti A on: 05/09/2015 10:16 AM   Modules accepted: Orders

## 2015-05-09 NOTE — Progress Notes (Signed)
Excisional Biopsy Procedure Note  Pre-operative Diagnosis: probable large warty lesion of left vulva, 2 cm  Post-operative Diagnosis: same  Locations: left vulva  Indications: irritating, in panty line  Anesthesia: Lidocaine 1% without epinephrine without added sodium bicarbonate  Procedure Details  The risks, benefits, indications, potential complications, and alternatives were explained to the patient and informed consent obtained.  The lesion and surrounding area was given a sterile prep using betadyne and draped in the usual sterile fashion. A scalpel was used to excise an elliptical area of skin approximately 4cm by 5cm. The wound was closed with 3-0 ethilon using simple interrupted stitches. Antibiotic ointment and a sterile dressing applied. The specimen was sent for pathologic examination. The patient tolerated the procedure well.  EBL: minimal  Findings: warty lesion, probable  Condition: Stable  Complications:  None  Plan: 1. Instructed to keep the wound dry and covered for 24-48 hours and clean thereafter. 2. Warning signs of infection were reviewed.   3. Recommended that the patient use OTC acetaminophen as needed for pain.  4. Return for suture removal in 1 week.

## 2015-05-16 ENCOUNTER — Ambulatory Visit (INDEPENDENT_AMBULATORY_CARE_PROVIDER_SITE_OTHER): Payer: BLUE CROSS/BLUE SHIELD | Admitting: Obstetrics & Gynecology

## 2015-05-16 ENCOUNTER — Encounter: Payer: Self-pay | Admitting: Obstetrics & Gynecology

## 2015-05-16 VITALS — BP 120/82 | Ht 68.5 in | Wt 325.0 lb

## 2015-05-16 DIAGNOSIS — O98313 Other infections with a predominantly sexual mode of transmission complicating pregnancy, third trimester: Secondary | ICD-10-CM

## 2015-05-16 DIAGNOSIS — A630 Anogenital (venereal) warts: Secondary | ICD-10-CM

## 2015-05-16 NOTE — Progress Notes (Signed)
Patient ID: Olivia Brooks, female   DOB: 11/27/1980, 34 y.o.   MRN: [id number] Sutures removed x 3 healing well pathology benign , condyloma accuminata Follow up ob visit in 2 weeks

## 2015-05-20 ENCOUNTER — Encounter: Payer: Self-pay | Admitting: Orthopedic Surgery

## 2015-05-20 ENCOUNTER — Ambulatory Visit (INDEPENDENT_AMBULATORY_CARE_PROVIDER_SITE_OTHER): Payer: BLUE CROSS/BLUE SHIELD | Admitting: Orthopedic Surgery

## 2015-05-20 VITALS — BP 120/65 | Ht 68.5 in | Wt 325.0 lb

## 2015-05-20 DIAGNOSIS — G5601 Carpal tunnel syndrome, right upper limb: Secondary | ICD-10-CM

## 2015-05-20 DIAGNOSIS — G5603 Carpal tunnel syndrome, bilateral upper limbs: Secondary | ICD-10-CM

## 2015-05-20 DIAGNOSIS — G5602 Carpal tunnel syndrome, left upper limb: Secondary | ICD-10-CM | POA: Diagnosis not present

## 2015-05-20 NOTE — Progress Notes (Addendum)
Patient ID: Olivia Brooks, female   DOB: [birth year], 34 y.o.   MRN: [id number]  New patient   Chief Complaint  Patient presents with  . Hand Problem    bilateral hand numbness and tingling, referred by Dr Elonda Husky     Olivia Brooks is a 34 y.o. female.   HPI 34 year old 6-1/2 months gestation presents for evaluation and treatment of carpal tunnel syndrome previously treated by bracing. She complains of bilateral hand pain with burning numbness and tingling severe keeping her up at night for several months duration unrelieved by bracing.  However she does not want injections and is not a candidate for surgery during the pregnancy so she is seeking orthopedic advice for her short-term disability. Review of Systems Ankle leg swelling depression and back pain are the positive findings  Past Medical History  Diagnosis Date  . Medical history non-contributory   . Hypertension   . Trichomonal vaginitis in pregnancy in second trimester 02/17/2015    Past Surgical History  Procedure Laterality Date  . No past surgeries      Family History  Problem Relation Age of Onset  . Cancer Mother   . Hyperlipidemia Sister     Social History Social History  Substance Use Topics  . Smoking status: Former Smoker    Types: Cigarettes  . Smokeless tobacco: Never Used     Comment: 1-2 cig daily  . Alcohol Use: No     Comment: not now    No Known Allergies  Current Outpatient Prescriptions  Medication Sig Dispense Refill  . aspirin 81 MG tablet Take 81 mg by mouth daily.    Marland Kitchen labetalol (NORMODYNE) 200 MG tablet Take 1 tablet (200 mg total) by mouth 2 (two) times daily. 180 tablet 3  . prenatal vitamin w/FE, FA (NATACHEW) 29-1 MG CHEW chewable tablet Chew 1 tablet by mouth daily at 12 noon. 30 tablet 11   No current facility-administered medications for this visit.       Physical Exam Blood pressure 120/65, height 5' 8.5" (1.74 m), weight 325 lb (147.419 kg), last menstrual  period 11/02/2014. Physical Exam The patient is well developed well nourished and well groomed. Orientation to person place and time is normal  Mood is pleasant. Ambulatory status is normal without a limp Skin remains intact without laceration ulceration or erythema Gross motor exam is intact without atrophy. Muscle tone normal grade 5 motor strength Neurologic exam shows decreased sensation in the thumb index long and middle fingers of both hands with intact sensation of the small finger. Carpal tunnel tenderness is noted bilaterally but she has good capillary refill and radial pulses bilaterally carpal tunnel compression test is positive no swelling or coolness noted to touch she has full range of motion and start joint stability is normal in both hands   Data Reviewed  none  Assessment   Bilateral carpal tunnel syndrome  Plan   Do not use hands for repetitive activities including work until the pregnancy has resolved  We attempted to call Dr. Elonda Husky, could not get in touch with him as were on hold for over 10 minutes and therefore I sent him a note

## 2015-05-20 NOTE — Patient Instructions (Addendum)
Give note:   Dx: carpal tunnel syndrome in Pregnancy, can not perform job duties until after delivery. Carpal Tunnel Syndrome The carpal tunnel is a narrow area located on the palm side of your wrist. The tunnel is formed by the wrist bones and ligaments. Nerves, blood vessels, and tendons pass through the carpal tunnel. Repeated wrist motion or certain diseases may cause swelling within the tunnel. This swelling pinches the main nerve in the wrist (median nerve) and causes the painful hand and arm condition called carpal tunnel syndrome. CAUSES   Repeated wrist motions.  Wrist injuries.  Certain diseases like arthritis, diabetes, alcoholism, hyperthyroidism, and kidney failure.  Obesity.  Pregnancy. SYMPTOMS   A "pins and needles" feeling in your fingers or hand, especially in your thumb, index and middle fingers.  Tingling or numbness in your fingers or hand.  An aching feeling in your entire arm, especially when your wrist and elbow are bent for long periods of time.  Wrist pain that goes up your arm to your shoulder.  Pain that goes down into your palm or fingers.  A weak feeling in your hands. DIAGNOSIS  Your health care provider will take your history and perform a physical exam. An electromyography test may be needed. This test measures electrical signals sent out by your nerves into the muscles. The electrical signals are usually slowed by carpal tunnel syndrome. You may also need X-rays. TREATMENT  Carpal tunnel syndrome may clear up by itself. Your health care provider may recommend a wrist splint or medicine such as a nonsteroidal anti-inflammatory medicine. Cortisone injections may help. Sometimes, surgery may be needed to free the pinched nerve.  HOME CARE INSTRUCTIONS   Take all medicine as directed by your health care provider. Only take over-the-counter or prescription medicines for pain, discomfort, or fever as directed by your health care provider.  If you were  given a splint to keep your wrist from bending, wear it as directed. It is important to wear the splint at night. Wear the splint for as long as you have pain or numbness in your hand, arm, or wrist. This may take 1 to 2 months.  Rest your wrist from any activity that may be causing your pain. If your symptoms are work-related, you may need to talk to your employer about changing to a job that does not require using your wrist.  Put ice on your wrist after long periods of wrist activity.  Put ice in a plastic bag.  Place a towel between your skin and the bag.  Leave the ice on for 15-20 minutes, 03-04 times a day.  Keep all follow-up visits as directed by your health care provider. This includes any orthopedic referrals, physical therapy, and rehabilitation. Any delay in getting necessary care could result in a delay or failure of your condition to heal. SEEK IMMEDIATE MEDICAL CARE IF:   You have new, unexplained symptoms.  Your symptoms get worse and are not helped or controlled with medicines. MAKE SURE YOU:   Understand these instructions.  Will watch your condition.  Will get help right away if you are not doing well or get worse. Document Released: 08/13/2000 Document Revised: 12/31/2013 Document Reviewed: 07/02/2011 [facility] Patient Information 2015 [place], [place]. This information is not intended to replace advice given to you by your health care provider. Make sure you discuss any questions you have with your health care provider.

## 2015-05-27 ENCOUNTER — Telehealth: Payer: Self-pay | Admitting: *Deleted

## 2015-05-27 NOTE — Telephone Encounter (Signed)
Pt informed her forms are at the front desk for pick up.

## 2015-05-28 ENCOUNTER — Telehealth: Payer: Self-pay | Admitting: Orthopedic Surgery

## 2015-05-28 NOTE — Telephone Encounter (Signed)
Opened in Error.

## 2015-05-30 ENCOUNTER — Encounter: Payer: Self-pay | Admitting: Obstetrics & Gynecology

## 2015-05-30 ENCOUNTER — Encounter: Payer: BLUE CROSS/BLUE SHIELD | Admitting: Obstetrics & Gynecology

## 2015-05-30 ENCOUNTER — Ambulatory Visit (INDEPENDENT_AMBULATORY_CARE_PROVIDER_SITE_OTHER): Payer: BLUE CROSS/BLUE SHIELD | Admitting: Obstetrics & Gynecology

## 2015-05-30 VITALS — BP 110/80 | HR 80 | Wt 326.0 lb

## 2015-05-30 DIAGNOSIS — O0943 Supervision of pregnancy with grand multiparity, third trimester: Secondary | ICD-10-CM

## 2015-05-30 DIAGNOSIS — Z1389 Encounter for screening for other disorder: Secondary | ICD-10-CM

## 2015-05-30 DIAGNOSIS — O26899 Other specified pregnancy related conditions, unspecified trimester: Secondary | ICD-10-CM

## 2015-05-30 DIAGNOSIS — Z331 Pregnant state, incidental: Secondary | ICD-10-CM

## 2015-05-30 DIAGNOSIS — G5600 Carpal tunnel syndrome, unspecified upper limb: Secondary | ICD-10-CM

## 2015-05-30 DIAGNOSIS — O10912 Unspecified pre-existing hypertension complicating pregnancy, second trimester: Secondary | ICD-10-CM

## 2015-05-30 DIAGNOSIS — O10919 Unspecified pre-existing hypertension complicating pregnancy, unspecified trimester: Secondary | ICD-10-CM

## 2015-05-30 DIAGNOSIS — O09893 Supervision of other high risk pregnancies, third trimester: Secondary | ICD-10-CM

## 2015-05-30 LAB — POCT URINALYSIS DIPSTICK
GLUCOSE UA: NEGATIVE
Ketones, UA: NEGATIVE
Leukocytes, UA: NEGATIVE
NITRITE UA: NEGATIVE
PROTEIN UA: 1
RBC UA: NEGATIVE

## 2015-05-30 NOTE — Progress Notes (Signed)
Fetal Surveillance Testing today:  FHR normal   High Risk Pregnancy Diagnosis(es):   Chronic Hypertension  G1P0 [redacted]w[redacted]d Estimated Date of Delivery: 08/04/15  Blood pressure 110/80, pulse 80, weight 326 lb (147.873 kg), last menstrual period 11/02/2014.  Urinalysis: Positive for 1 + protein   HPI: The patient is being seen today for ongoing management of chronic hypertension. Today she reports continued carpal tunnel issues   BP weight and urine results all reviewed and noted. Patient reports good fetal movement, denies any bleeding and no rupture of membranes symptoms or regular contractions.  Fundal Height:  U+24 Fetal Heart rate:  145 Edema:    Patient is without complaints other than noted in her HPI. All questions were answered.  All lab and sonogram results have been reviewed. Comments:    Assessment:  1.  Pregnancy at [redacted]w[redacted]d,  Estimated Date of Delivery: 08/04/15 :                          2.  Chronic hypertension                        3.  Carpal tunnel and sciatica  Medication(s) Plans:  Labetalol 200 bid + ASA  Treatment Plan:  Begin twice weekly at 32 weeks  Return in about 2 weeks (around 06/13/2015) for BPP/sono, HROB. for appointment for high risk OB care  No orders of the defined types were placed in this encounter.   Orders Placed This Encounter  Procedures  . [place] UA Cord Doppler  . US Fetal BPP W/O Non Stress  . POCT urinalysis dipstick

## 2015-06-02 ENCOUNTER — Telehealth: Payer: Self-pay | Admitting: Orthopedic Surgery

## 2015-06-02 NOTE — Telephone Encounter (Signed)
I don't have any advice and I don't think she needs a nerve study

## 2015-06-02 NOTE — Telephone Encounter (Signed)
Patient states she has to appeal the short term disability due to not enough medical information, Dr. Elonda Husky stated she may need a nerve conduction study that this may go ahead an approve the short term disability proving that she cant work at this time due to bilateral hand numbness, please advise?

## 2015-06-03 NOTE — Telephone Encounter (Signed)
Noted. Patient is aware. 

## 2015-06-10 NOTE — Progress Notes (Signed)
This encounter was created in error - please disregard.

## 2015-06-13 ENCOUNTER — Ambulatory Visit (INDEPENDENT_AMBULATORY_CARE_PROVIDER_SITE_OTHER): Payer: BLUE CROSS/BLUE SHIELD

## 2015-06-13 ENCOUNTER — Other Ambulatory Visit: Payer: Self-pay | Admitting: Obstetrics & Gynecology

## 2015-06-13 ENCOUNTER — Ambulatory Visit (INDEPENDENT_AMBULATORY_CARE_PROVIDER_SITE_OTHER): Payer: BLUE CROSS/BLUE SHIELD | Admitting: Obstetrics & Gynecology

## 2015-06-13 ENCOUNTER — Encounter: Payer: Self-pay | Admitting: Obstetrics & Gynecology

## 2015-06-13 VITALS — BP 120/80 | HR 72 | Wt 329.0 lb

## 2015-06-13 DIAGNOSIS — O98513 Other viral diseases complicating pregnancy, third trimester: Principal | ICD-10-CM

## 2015-06-13 DIAGNOSIS — G5600 Carpal tunnel syndrome, unspecified upper limb: Secondary | ICD-10-CM

## 2015-06-13 DIAGNOSIS — O10919 Unspecified pre-existing hypertension complicating pregnancy, unspecified trimester: Secondary | ICD-10-CM

## 2015-06-13 DIAGNOSIS — Z331 Pregnant state, incidental: Secondary | ICD-10-CM

## 2015-06-13 DIAGNOSIS — O10912 Unspecified pre-existing hypertension complicating pregnancy, second trimester: Secondary | ICD-10-CM

## 2015-06-13 DIAGNOSIS — Z1389 Encounter for screening for other disorder: Secondary | ICD-10-CM

## 2015-06-13 DIAGNOSIS — O0943 Supervision of pregnancy with grand multiparity, third trimester: Secondary | ICD-10-CM

## 2015-06-13 DIAGNOSIS — O09893 Supervision of other high risk pregnancies, third trimester: Secondary | ICD-10-CM

## 2015-06-13 DIAGNOSIS — B009 Herpesviral infection, unspecified: Secondary | ICD-10-CM

## 2015-06-13 DIAGNOSIS — O26899 Other specified pregnancy related conditions, unspecified trimester: Secondary | ICD-10-CM

## 2015-06-13 LAB — POCT URINALYSIS DIPSTICK
GLUCOSE UA: NEGATIVE
KETONES UA: NEGATIVE
Leukocytes, UA: NEGATIVE
NITRITE UA: NEGATIVE
Protein, UA: NEGATIVE
RBC UA: NEGATIVE

## 2015-06-13 NOTE — Progress Notes (Addendum)
[place] 32+4wks,post pl gr 3,bilat adnexa's wnl,BPP 8/8, RI .72,.76,.84,cephalic,fht 511MYT,RZN 3567O 46.7%, BPD 97.2%,HC 34.2% (limited because of pos and body habitus) afi 15cm,LVEICF N/C

## 2015-06-13 NOTE — Progress Notes (Signed)
Fetal Surveillance Testing today:  Sonogram  Is normal with BPP 8/8   High Risk Pregnancy Diagnosis(es):   Chronic hypertension  G1P0 [redacted]w[redacted]d Estimated Date of Delivery: 08/04/15  Blood pressure 120/80, pulse 72, weight 329 lb (149.233 kg), last menstrual period 11/02/2014.  Urinalysis: Negative   HPI: The patient is being seen today for ongoing management of Chronic hypertension. Today she reports carpal tunnel   BP weight and urine results all reviewed and noted. Patient reports good fetal movement, denies any bleeding and no rupture of membranes symptoms or regular contractions.  Fundal Height:  U+26 Fetal Heart rate:  152 Edema:  1+  Patient is without complaints other than noted in her HPI. All questions were answered.  All lab and sonogram results have been reviewed. Comments: abnormal:    Assessment:  1.  Pregnancy at [redacted]w[redacted]d,  Estimated Date of Delivery: 08/04/15 :                          2.  Chronic Hypertension                        3.  LV EICF  Medication(s) Plans:  Labetalol 200 BID + baby ASA  Treatment Plan:  Twice weekly surveillance  No Follow-up on file. for appointment for high risk OB care  No orders of the defined types were placed in this encounter.   Orders Placed This Encounter  Procedures  . POCT urinalysis dipstick

## 2015-06-16 ENCOUNTER — Telehealth: Payer: Self-pay | Admitting: *Deleted

## 2015-06-16 NOTE — Telephone Encounter (Signed)
Dr. Mina Marble office is requesting Dr. Elonda Husky to return call to [phone] ext. [phone]

## 2015-06-17 ENCOUNTER — Encounter: Payer: Self-pay | Admitting: Obstetrics & Gynecology

## 2015-06-17 ENCOUNTER — Ambulatory Visit (INDEPENDENT_AMBULATORY_CARE_PROVIDER_SITE_OTHER): Payer: BLUE CROSS/BLUE SHIELD | Admitting: Obstetrics & Gynecology

## 2015-06-17 VITALS — BP 140/80 | HR 76 | Wt 332.0 lb

## 2015-06-17 DIAGNOSIS — O0943 Supervision of pregnancy with grand multiparity, third trimester: Secondary | ICD-10-CM

## 2015-06-17 DIAGNOSIS — O10919 Unspecified pre-existing hypertension complicating pregnancy, unspecified trimester: Secondary | ICD-10-CM

## 2015-06-17 DIAGNOSIS — Z1389 Encounter for screening for other disorder: Secondary | ICD-10-CM

## 2015-06-17 DIAGNOSIS — O09893 Supervision of other high risk pregnancies, third trimester: Secondary | ICD-10-CM

## 2015-06-17 DIAGNOSIS — O10913 Unspecified pre-existing hypertension complicating pregnancy, third trimester: Secondary | ICD-10-CM

## 2015-06-17 DIAGNOSIS — Z331 Pregnant state, incidental: Secondary | ICD-10-CM

## 2015-06-17 LAB — POCT URINALYSIS DIPSTICK
GLUCOSE UA: NEGATIVE
Ketones, UA: NEGATIVE
Leukocytes, UA: NEGATIVE
NITRITE UA: NEGATIVE
Protein, UA: NEGATIVE
RBC UA: NEGATIVE

## 2015-06-17 NOTE — Telephone Encounter (Signed)
i have returned call x 2

## 2015-06-17 NOTE — Progress Notes (Signed)
Fetal Surveillance Testing today:  Reactive NST   High Risk Pregnancy Diagnosis(es):   C HTN  G1P0 [redacted]w[redacted]d Estimated Date of Delivery: 08/04/15  Blood pressure 140/80, pulse 76, weight 332 lb (150.594 kg), last menstrual period 11/02/2014.  Urinalysis: Positive for 1+ protein   HPI: The patient is being seen today for ongoing management of CHTN. Today she reports no complaints no visual symptoms no headache no right upper quadrant pain   BP weight and urine results all reviewed and noted. Patient reports good fetal movement, denies any bleeding and no rupture of membranes symptoms or regular contractions.  Fundal Height:  145 Fetal Heart rate:  Umbilicus +00 cm Edema:  3+ which is stable  Patient is without complaints other than noted in her HPI. All questions were answered.  All lab and sonogram results have been reviewed. Comments:    Assessment:  1.  Pregnancy at [redacted]w[redacted]d,  Estimated Date of Delivery: 08/04/15 :                          2.  Chronic hypertension                        3.  Left ventricular EICF  Medication(s) Plans:  Labetalol 200 mg twice a day, baby aspirin,  begin Valtrex 400 3 times a day  Treatment Plan:  Twice weekly surveillance, NST alternating with sonogram, induction at 39 weeks and less otherwise clinically indicated  Return for NST, HROB. for appointment for high risk OB care  No orders of the defined types were placed in this encounter.   Orders Placed This Encounter  Procedures  . POCT urinalysis dipstick

## 2015-06-18 ENCOUNTER — Telehealth: Payer: Self-pay | Admitting: Orthopedic Surgery

## 2015-06-18 NOTE — Telephone Encounter (Addendum)
Call received from physician representing patient's short-term disability, [employer], contact "[employer]" requesting Peer to peer review - Dr Maryjane Hurter (states "pronounced [place]") - ph # [phone] - states that he needs to speak with Dr Aline Brochure regarding period of disability from 8/15-05/06/15 and from 05/16/15 onward.  I relayed (and he is aware) that patient was seen at our clinic 1 time per referral from Dr Elonda Husky, on 05/20/15. (patient had signed a medical release form) Previous phone note addressed on 06/02/15 per Dr Ruthe Mannan response, "I don't have any advice and I don't think she needs a nerve study."  Dr's call back phone # as noted.

## 2015-06-19 NOTE — Telephone Encounter (Signed)
Call him back and tell him my note stands as is and dates out stand as is

## 2015-06-19 NOTE — Telephone Encounter (Signed)
Called back and relayed per Dr Harrison's response.

## 2015-06-20 ENCOUNTER — Ambulatory Visit (INDEPENDENT_AMBULATORY_CARE_PROVIDER_SITE_OTHER): Payer: BLUE CROSS/BLUE SHIELD | Admitting: Obstetrics & Gynecology

## 2015-06-20 VITALS — BP 144/90 | HR 62 | Wt 332.0 lb

## 2015-06-20 DIAGNOSIS — O0943 Supervision of pregnancy with grand multiparity, third trimester: Secondary | ICD-10-CM

## 2015-06-20 DIAGNOSIS — O10919 Unspecified pre-existing hypertension complicating pregnancy, unspecified trimester: Secondary | ICD-10-CM

## 2015-06-20 DIAGNOSIS — Z331 Pregnant state, incidental: Secondary | ICD-10-CM

## 2015-06-20 DIAGNOSIS — Z1389 Encounter for screening for other disorder: Secondary | ICD-10-CM

## 2015-06-20 DIAGNOSIS — O09893 Supervision of other high risk pregnancies, third trimester: Secondary | ICD-10-CM

## 2015-06-20 DIAGNOSIS — O10912 Unspecified pre-existing hypertension complicating pregnancy, second trimester: Secondary | ICD-10-CM | POA: Diagnosis not present

## 2015-06-20 LAB — POCT URINALYSIS DIPSTICK
GLUCOSE UA: NEGATIVE
Ketones, UA: NEGATIVE
Leukocytes, UA: NEGATIVE
NITRITE UA: NEGATIVE
Protein, UA: 1
RBC UA: NEGATIVE

## 2015-06-20 NOTE — Progress Notes (Signed)
Fetal Surveillance Testing today:  Reactive NST   High Risk Pregnancy Diagnosis(es):   Chronic hypertension  G1P0 [redacted]w[redacted]d Estimated Date of Delivery: 08/04/15  Blood pressure 144/90, pulse 62, weight 332 lb (150.594 kg), last menstrual period 11/02/2014.  Urinalysis: 1+ protein   HPI: The patient is being seen today for ongoing management of chronic hypertension. Today she reports some pain in her hips and down her legs otherwise normal   BP weight and urine results all reviewed and noted. Patient reports good fetal movement, denies any bleeding and no rupture of membranes symptoms or regular contractions.  Fundal Height:  Umbilicus +50 cm Fetal Heart rate:  140 Edema:  3+ stable  Patient is without complaints other than noted in her HPI. All questions were answered.  All lab and sonogram results have been reviewed. Comments:    Assessment:  1.  Pregnancy at 106w4d,  Estimated Date of Delivery: 08/04/15 :                          2.  Chronic hypertension                        3.  LV EICF  Medication(s) Plans:  Labetalol 200 mg twice a day and a baby aspirin daily  Treatment Plan:  Twice weekly testing NST alternating with sonogram, plan induction of labor at 39 weeks unless otherwise clinically indicated  Return in about 4 days (around 06/24/2015) for BPP/sono, HROB. for appointment for high risk OB care  Meds ordered this encounter  Medications  . omeprazole (PRILOSEC) 20 MG capsule    Sig: Take 20 mg by mouth as needed.    Orders Placed This Encounter  Procedures  . [place] UA Cord Doppler  . US Fetal BPP W/O Non Stress  . POCT urinalysis dipstick

## 2015-06-24 ENCOUNTER — Ambulatory Visit (INDEPENDENT_AMBULATORY_CARE_PROVIDER_SITE_OTHER): Payer: BLUE CROSS/BLUE SHIELD | Admitting: Obstetrics & Gynecology

## 2015-06-24 ENCOUNTER — Ambulatory Visit (INDEPENDENT_AMBULATORY_CARE_PROVIDER_SITE_OTHER): Payer: BLUE CROSS/BLUE SHIELD

## 2015-06-24 ENCOUNTER — Encounter: Payer: Self-pay | Admitting: Obstetrics & Gynecology

## 2015-06-24 VITALS — BP 128/80 | HR 72 | Wt 337.0 lb

## 2015-06-24 DIAGNOSIS — O10919 Unspecified pre-existing hypertension complicating pregnancy, unspecified trimester: Secondary | ICD-10-CM

## 2015-06-24 DIAGNOSIS — O10912 Unspecified pre-existing hypertension complicating pregnancy, second trimester: Secondary | ICD-10-CM

## 2015-06-24 DIAGNOSIS — Z1389 Encounter for screening for other disorder: Secondary | ICD-10-CM

## 2015-06-24 DIAGNOSIS — O98513 Other viral diseases complicating pregnancy, third trimester: Principal | ICD-10-CM

## 2015-06-24 DIAGNOSIS — O0943 Supervision of pregnancy with grand multiparity, third trimester: Secondary | ICD-10-CM

## 2015-06-24 DIAGNOSIS — O09893 Supervision of other high risk pregnancies, third trimester: Secondary | ICD-10-CM

## 2015-06-24 DIAGNOSIS — Z331 Pregnant state, incidental: Secondary | ICD-10-CM

## 2015-06-24 DIAGNOSIS — B009 Herpesviral infection, unspecified: Secondary | ICD-10-CM

## 2015-06-24 LAB — POCT URINALYSIS DIPSTICK
Blood, UA: NEGATIVE
Glucose, UA: NEGATIVE
Ketones, UA: NEGATIVE
Leukocytes, UA: NEGATIVE
NITRITE UA: NEGATIVE
PROTEIN UA: NEGATIVE

## 2015-06-24 NOTE — Progress Notes (Signed)
Fetal Surveillance Testing today:  Sonogram: BPP 8/9, normal fluid, stable Doppler flow   High Risk Pregnancy Diagnosis(es):   Chronic hypertension  G1P0 [redacted]w[redacted]d Estimated Date of Delivery: 08/04/15  Blood pressure 128/80, pulse 72, weight 337 lb (152.862 kg), last menstrual period 11/02/2014.  Urinalysis: Negative   HPI: The patient is being seen today for ongoing management of chronic hypertension. Today she reports no headache visual changes CNS symptoms no right upper quadrant pain reports good fetal movement   BP weight and urine results all reviewed and noted. Patient reports good fetal movement, denies any bleeding and no rupture of membranes symptoms or regular contractions.  Fundal Height:  Umbilicus +81 cm Fetal Heart rate:  135 Edema:  3+ stable  Patient is without complaints other than noted in her HPI. All questions were answered.  All lab and sonogram results have been reviewed. Comments:    Assessment:  1.  Pregnancy at [redacted]w[redacted]d,  Estimated Date of Delivery: 08/04/15 :                          2.  Chronic hypertension                        3.  LV EICF  Medication(s) Plans:  Labetalol 200 twice a day baby aspirin daily  Treatment Plan:  Twice weekly surveillance sonogram alternating with NST induced 39 weeks  Return in about 3 days (around 06/27/2015) for NST, HROB. for appointment for high risk OB care  No orders of the defined types were placed in this encounter.   Orders Placed This Encounter  Procedures  . POCT urinalysis dipstick

## 2015-06-24 NOTE — Progress Notes (Signed)
[place] 34+1wks,cephalic,BPP 9/6,QIWL pl gr 3,bilat adnexa wnl, afi 12cm,RI .71,.73,fhr 140 bpm

## 2015-06-25 ENCOUNTER — Telehealth: Payer: Self-pay | Admitting: *Deleted

## 2015-06-25 NOTE — Telephone Encounter (Signed)
Pt c/o vaginal odor, no discharge. Pt states she believes it is just moisture were she sweats and asking if baby powder use is safe. Pt informed can use baby powder and see if that helps with moisture. If no improvement call our office back. Pt verbalized understanding.

## 2015-06-27 ENCOUNTER — Encounter: Payer: Self-pay | Admitting: Obstetrics and Gynecology

## 2015-06-27 ENCOUNTER — Ambulatory Visit (INDEPENDENT_AMBULATORY_CARE_PROVIDER_SITE_OTHER): Payer: BLUE CROSS/BLUE SHIELD | Admitting: Obstetrics & Gynecology

## 2015-06-27 VITALS — BP 110/68 | HR 76 | Wt 334.0 lb

## 2015-06-27 DIAGNOSIS — O10913 Unspecified pre-existing hypertension complicating pregnancy, third trimester: Secondary | ICD-10-CM | POA: Diagnosis not present

## 2015-06-27 DIAGNOSIS — Z331 Pregnant state, incidental: Secondary | ICD-10-CM

## 2015-06-27 DIAGNOSIS — O10912 Unspecified pre-existing hypertension complicating pregnancy, second trimester: Secondary | ICD-10-CM

## 2015-06-27 DIAGNOSIS — O09893 Supervision of other high risk pregnancies, third trimester: Secondary | ICD-10-CM

## 2015-06-27 DIAGNOSIS — O358XX1 Maternal care for other (suspected) fetal abnormality and damage, fetus 1: Secondary | ICD-10-CM

## 2015-06-27 DIAGNOSIS — IMO0001 Reserved for inherently not codable concepts without codable children: Secondary | ICD-10-CM

## 2015-06-27 DIAGNOSIS — O10919 Unspecified pre-existing hypertension complicating pregnancy, unspecified trimester: Secondary | ICD-10-CM

## 2015-06-27 DIAGNOSIS — Z1389 Encounter for screening for other disorder: Secondary | ICD-10-CM

## 2015-06-27 LAB — POCT URINALYSIS DIPSTICK
Blood, UA: NEGATIVE
Glucose, UA: NEGATIVE
KETONES UA: NEGATIVE
LEUKOCYTES UA: NEGATIVE
Nitrite, UA: NEGATIVE
Protein, UA: 1

## 2015-06-27 MED ORDER — ACYCLOVIR 400 MG PO TABS: 400.0000 mg | ORAL_TABLET | Freq: Every day | ORAL | Status: DC

## 2015-06-27 NOTE — Progress Notes (Signed)
Fetal Surveillance Testing today:  Reactive NST   High Risk Pregnancy Diagnosis(es):   Chronic hypertension  G1P0 [redacted]w[redacted]d Estimated Date of Delivery: 08/04/15  Blood pressure 110/68, pulse 76, weight 334 lb (151.501 kg), last menstrual period 11/02/2014.  Urinalysis: Positive for 1+ protein   HPI: The patient is being seen today for ongoing management of chronic hypertension. Today she reports no problems   BP weight and urine results all reviewed and noted. Patient reports good fetal movement, denies any bleeding and no rupture of membranes symptoms or regular contractions.  Fundal Height:  U+32 Fetal Heart rate:  145 Edema:  2+  Patient is without complaints other than noted in her HPI. All questions were answered.  All lab and sonogram results have been reviewed. Comments: abnormal: sonogram  EICF  Assessment:  1.  Pregnancy at [redacted]w[redacted]d,  Estimated Date of Delivery: 08/04/15 :                          2.  Chronic HTN                        3.  EICF  Medication(s) Plans:  Labetalol 200 BID  Treatment Plan:  Twice weekly surveillance  Return in about 4 days (around 07/01/2015) for BPP/sono, HROB. for appointment for high risk OB care  No orders of the defined types were placed in this encounter.   Orders Placed This Encounter  Procedures  . POCT urinalysis dipstick  . Fetal nonstress test

## 2015-06-27 NOTE — Progress Notes (Signed)
Pt denies any problems or concerns at this time.  

## 2015-06-30 ENCOUNTER — Other Ambulatory Visit: Payer: Self-pay | Admitting: Obstetrics & Gynecology

## 2015-06-30 DIAGNOSIS — O09893 Supervision of other high risk pregnancies, third trimester: Secondary | ICD-10-CM

## 2015-06-30 DIAGNOSIS — O10919 Unspecified pre-existing hypertension complicating pregnancy, unspecified trimester: Secondary | ICD-10-CM

## 2015-07-01 ENCOUNTER — Ambulatory Visit (INDEPENDENT_AMBULATORY_CARE_PROVIDER_SITE_OTHER): Payer: BLUE CROSS/BLUE SHIELD | Admitting: Obstetrics & Gynecology

## 2015-07-01 ENCOUNTER — Telehealth: Payer: Self-pay | Admitting: *Deleted

## 2015-07-01 ENCOUNTER — Encounter: Payer: Self-pay | Admitting: Obstetrics & Gynecology

## 2015-07-01 ENCOUNTER — Ambulatory Visit (INDEPENDENT_AMBULATORY_CARE_PROVIDER_SITE_OTHER): Payer: BLUE CROSS/BLUE SHIELD

## 2015-07-01 VITALS — BP 118/70 | HR 74 | Wt 335.0 lb

## 2015-07-01 DIAGNOSIS — O10912 Unspecified pre-existing hypertension complicating pregnancy, second trimester: Secondary | ICD-10-CM

## 2015-07-01 DIAGNOSIS — IMO0001 Reserved for inherently not codable concepts without codable children: Secondary | ICD-10-CM

## 2015-07-01 DIAGNOSIS — O09893 Supervision of other high risk pregnancies, third trimester: Secondary | ICD-10-CM

## 2015-07-01 DIAGNOSIS — O358XX1 Maternal care for other (suspected) fetal abnormality and damage, fetus 1: Secondary | ICD-10-CM

## 2015-07-01 DIAGNOSIS — Z331 Pregnant state, incidental: Secondary | ICD-10-CM

## 2015-07-01 DIAGNOSIS — B009 Herpesviral infection, unspecified: Secondary | ICD-10-CM

## 2015-07-01 DIAGNOSIS — O10919 Unspecified pre-existing hypertension complicating pregnancy, unspecified trimester: Secondary | ICD-10-CM

## 2015-07-01 DIAGNOSIS — Z1389 Encounter for screening for other disorder: Secondary | ICD-10-CM

## 2015-07-01 DIAGNOSIS — O98513 Other viral diseases complicating pregnancy, third trimester: Principal | ICD-10-CM

## 2015-07-01 LAB — POCT URINALYSIS DIPSTICK
Blood, UA: NEGATIVE
Glucose, UA: NEGATIVE
KETONES UA: NEGATIVE
LEUKOCYTES UA: NEGATIVE
Nitrite, UA: NEGATIVE

## 2015-07-01 NOTE — Progress Notes (Signed)
[place] 35+1wks,cephalic,post pl gr 3, fhr 154 bpm,afi 14 cm,RI .71,.73,EFW 2489 g 37%,BPP 8/8

## 2015-07-01 NOTE — Progress Notes (Signed)
Fetal Surveillance Testing today:  Sonogram 8/8 BPP, dopplers with excellent flow and EFW 39%   High Risk Pregnancy Diagnosis(es):   Chronic hypertension  G1P0 [redacted]w[redacted]d Estimated Date of Delivery: 08/04/15  Blood pressure 118/70, pulse 74, weight 335 lb (151.955 kg), last menstrual period 11/02/2014.  Urinalysis: Negative   HPI: The patient is being seen today for ongoing management of chronic hypertension. Today she reports swelling no other problems   BP weight and urine results all reviewed and noted. Patient reports good fetal movement, denies any bleeding and no rupture of membranes symptoms or regular contractions.  Fundal Height:  U+34 Fetal Heart rate:  154 Edema:  3+  Patient is without complaints other than noted in her HPI. All questions were answered.  All lab and sonogram results have been reviewed. Comments:    Assessment:  1.  Pregnancy at [redacted]w[redacted]d,  Estimated Date of Delivery: 08/04/15 :                          2.  Chronic hypertension                        3.  Lv EICF  Medication(s) Plans:  Labetalol 200 BID  Treatment Plan:  Twice weekly surveillance, induce at 39 weeks  Return in about 3 days (around 07/04/2015) for NST, HROB. for appointment for high risk OB care  No orders of the defined types were placed in this encounter.   Orders Placed This Encounter  Procedures  . POCT urinalysis dipstick

## 2015-07-01 NOTE — Telephone Encounter (Signed)
Pt states saw Dr. 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  Pt informed to continue to take the Acyclovir and keep her appt on 07/04/2015 and Dr. Elonda Husky could evaluate at that time. Pt verbalized understanding.

## 2015-07-04 ENCOUNTER — Other Ambulatory Visit: Payer: BLUE CROSS/BLUE SHIELD | Admitting: Obstetrics and Gynecology

## 2015-07-04 ENCOUNTER — Encounter: Payer: Self-pay | Admitting: Obstetrics & Gynecology

## 2015-07-04 ENCOUNTER — Ambulatory Visit (INDEPENDENT_AMBULATORY_CARE_PROVIDER_SITE_OTHER): Payer: BLUE CROSS/BLUE SHIELD | Admitting: Obstetrics & Gynecology

## 2015-07-04 VITALS — BP 100/90 | HR 76 | Wt 338.0 lb

## 2015-07-04 DIAGNOSIS — O10912 Unspecified pre-existing hypertension complicating pregnancy, second trimester: Secondary | ICD-10-CM | POA: Diagnosis not present

## 2015-07-04 DIAGNOSIS — Z331 Pregnant state, incidental: Secondary | ICD-10-CM

## 2015-07-04 DIAGNOSIS — O09893 Supervision of other high risk pregnancies, third trimester: Secondary | ICD-10-CM

## 2015-07-04 DIAGNOSIS — IMO0001 Reserved for inherently not codable concepts without codable children: Secondary | ICD-10-CM

## 2015-07-04 DIAGNOSIS — Z1389 Encounter for screening for other disorder: Secondary | ICD-10-CM

## 2015-07-04 DIAGNOSIS — O358XX1 Maternal care for other (suspected) fetal abnormality and damage, fetus 1: Secondary | ICD-10-CM

## 2015-07-04 DIAGNOSIS — O10919 Unspecified pre-existing hypertension complicating pregnancy, unspecified trimester: Secondary | ICD-10-CM

## 2015-07-04 LAB — POCT URINALYSIS DIPSTICK
Glucose, UA: NEGATIVE
KETONES UA: NEGATIVE
LEUKOCYTES UA: NEGATIVE
NITRITE UA: NEGATIVE

## 2015-07-04 NOTE — Progress Notes (Signed)
Fetal Surveillance Testing today:  reactiveNST   High Risk Pregnancy Diagnosis(es):   Chronic hypertension  G1P0 [redacted]w[redacted]d Estimated Date of Delivery: 08/04/15  Blood pressure 100/90, pulse 76, weight 338 lb (153.316 kg), last menstrual period 11/02/2014.  Urinalysis: Negative   HPI: The patient is being seen today for ongoing management of chronic hypertension. Today she reports no problems   BP weight and urine results all reviewed and noted. Patient reports good fetal movement, denies any bleeding and no rupture of membranes symptoms or regular contractions.  Fundal Height:  Umb + 34 Fetal Heart rate:  145 Edema:  3+  Patient is without complaints other than noted in her HPI. All questions were answered.  All lab and sonogram results have been reviewed. Comments: abnormal:    Assessment:  1.  Pregnancy at [redacted]w[redacted]d,  Estimated Date of Delivery: 08/04/15 :                          2.  Chronic Hypertension                        3.  LV EICF                        4.  Morbid obesity  Medication(s) Plans:  No changes labetalol 200 BID  Treatment Plan:  Twice weekly surveillance deliver at 39 weeks or as indicated  Return in about 4 days (around 07/08/2015) for BPP/sono, HROB. for appointment for high risk OB care  No orders of the defined types were placed in this encounter.   Orders Placed This Encounter  Procedures  . US Fetal BPP W/O Non Stress  . [place] UA Cord Doppler  . POCT urinalysis dipstick

## 2015-07-08 ENCOUNTER — Ambulatory Visit (INDEPENDENT_AMBULATORY_CARE_PROVIDER_SITE_OTHER): Payer: BLUE CROSS/BLUE SHIELD | Admitting: Obstetrics & Gynecology

## 2015-07-08 ENCOUNTER — Encounter: Payer: Self-pay | Admitting: Obstetrics & Gynecology

## 2015-07-08 ENCOUNTER — Ambulatory Visit (INDEPENDENT_AMBULATORY_CARE_PROVIDER_SITE_OTHER): Payer: BLUE CROSS/BLUE SHIELD

## 2015-07-08 VITALS — BP 120/80 | HR 72 | Wt 342.0 lb

## 2015-07-08 DIAGNOSIS — O09893 Supervision of other high risk pregnancies, third trimester: Secondary | ICD-10-CM

## 2015-07-08 DIAGNOSIS — B009 Herpesviral infection, unspecified: Secondary | ICD-10-CM

## 2015-07-08 DIAGNOSIS — Z331 Pregnant state, incidental: Secondary | ICD-10-CM

## 2015-07-08 DIAGNOSIS — O10919 Unspecified pre-existing hypertension complicating pregnancy, unspecified trimester: Secondary | ICD-10-CM

## 2015-07-08 DIAGNOSIS — IMO0001 Reserved for inherently not codable concepts without codable children: Secondary | ICD-10-CM

## 2015-07-08 DIAGNOSIS — O10912 Unspecified pre-existing hypertension complicating pregnancy, second trimester: Secondary | ICD-10-CM | POA: Diagnosis not present

## 2015-07-08 DIAGNOSIS — O358XX1 Maternal care for other (suspected) fetal abnormality and damage, fetus 1: Secondary | ICD-10-CM

## 2015-07-08 DIAGNOSIS — Z1389 Encounter for screening for other disorder: Secondary | ICD-10-CM

## 2015-07-08 DIAGNOSIS — O98513 Other viral diseases complicating pregnancy, third trimester: Principal | ICD-10-CM

## 2015-07-08 LAB — POCT URINALYSIS DIPSTICK
Blood, UA: NEGATIVE
Glucose, UA: NEGATIVE
KETONES UA: NEGATIVE
LEUKOCYTES UA: NEGATIVE
Nitrite, UA: NEGATIVE

## 2015-07-08 NOTE — Progress Notes (Signed)
[place] 36+1wks,cephalic,BPP 5/8,XEN 40.7,WKG 132 bpm,bilat adnexa's wnl,RI .71,.70,.81,post pl gr 3

## 2015-07-08 NOTE — Progress Notes (Signed)
Fetal Surveillance Testing today:  Sonogram is reassuring BPP 8/8 with normal fluid and Doppler flow   High Risk Pregnancy Diagnosis(es):   Chronic Hypertension  G1P0 [redacted]w[redacted]d Estimated Date of Delivery: 08/04/15  Blood pressure 120/80, pulse 72, weight 342 lb (155.13 kg), last menstrual period 11/02/2014.  Urinalysis: Negative   HPI: The patient is being seen today for ongoing management of chronic hypertension. Today she reports pelvic back pain   BP weight and urine results all reviewed and noted. Patient reports good fetal movement, denies any bleeding and no rupture of membranes symptoms or regular contractions.  Fundal Height:  Umbilicus + 34 Fetal Heart rate:  145 Edema:  3+  Patient is without complaints other than noted in her HPI. All questions were answered.  All lab and sonogram results have been reviewed. Comments: normal   Assessment:  1.  Pregnancy at [redacted]w[redacted]d,  Estimated Date of Delivery: 08/04/15 :                          2.  Chronic Hypertension                        3.    Medication(s) Plans:  Labetalol 200 BID  Treatment Plan:  Twice weekly assessments, deliver at 39 weeks or as clinically indicated  No Follow-up on file. for appointment for high risk OB care  No orders of the defined types were placed in this encounter.   Orders Placed This Encounter  Procedures  . POCT urinalysis dipstick

## 2015-07-11 ENCOUNTER — Ambulatory Visit (INDEPENDENT_AMBULATORY_CARE_PROVIDER_SITE_OTHER): Payer: BLUE CROSS/BLUE SHIELD | Admitting: Obstetrics & Gynecology

## 2015-07-11 ENCOUNTER — Other Ambulatory Visit: Payer: Self-pay | Admitting: Obstetrics & Gynecology

## 2015-07-11 VITALS — BP 128/80 | HR 66 | Wt 341.0 lb

## 2015-07-11 DIAGNOSIS — O358XX1 Maternal care for other (suspected) fetal abnormality and damage, fetus 1: Secondary | ICD-10-CM

## 2015-07-11 DIAGNOSIS — Z3685 Encounter for antenatal screening for Streptococcus B: Secondary | ICD-10-CM

## 2015-07-11 DIAGNOSIS — IMO0001 Reserved for inherently not codable concepts without codable children: Secondary | ICD-10-CM

## 2015-07-11 DIAGNOSIS — Z331 Pregnant state, incidental: Secondary | ICD-10-CM

## 2015-07-11 DIAGNOSIS — O09893 Supervision of other high risk pregnancies, third trimester: Secondary | ICD-10-CM

## 2015-07-11 DIAGNOSIS — Z369 Encounter for antenatal screening, unspecified: Secondary | ICD-10-CM

## 2015-07-11 DIAGNOSIS — O10912 Unspecified pre-existing hypertension complicating pregnancy, second trimester: Secondary | ICD-10-CM | POA: Diagnosis not present

## 2015-07-11 DIAGNOSIS — Z1389 Encounter for screening for other disorder: Secondary | ICD-10-CM

## 2015-07-11 DIAGNOSIS — O10919 Unspecified pre-existing hypertension complicating pregnancy, unspecified trimester: Secondary | ICD-10-CM

## 2015-07-11 LAB — POCT URINALYSIS DIPSTICK
Blood, UA: NEGATIVE
Glucose, UA: NEGATIVE
Ketones, UA: NEGATIVE
LEUKOCYTES UA: NEGATIVE
NITRITE UA: NEGATIVE
PROTEIN UA: 1

## 2015-07-11 LAB — OB RESULTS CONSOLE GBS: STREP GROUP B AG: NEGATIVE

## 2015-07-11 NOTE — Progress Notes (Signed)
Fetal Surveillance Testing today:  Reactive NST   High Risk Pregnancy Diagnosis(es):   Chronic hypertension  G1P0 [redacted]w[redacted]d Estimated Date of Delivery: 08/04/15  Blood pressure 128/80, pulse 66, weight 341 lb (154.677 kg), last menstrual period 11/02/2014.  Urinalysis: Positive for 1+ protein   HPI: The patient is being seen today for ongoing management of chronic hypertension. Today she reports pelvic pressure   BP weight and urine results all reviewed and noted. Patient reports good fetal movement, denies any bleeding and no rupture of membranes symptoms or regular contractions.  Fundal Height:  U+36 Fetal Heart rate:  145 Edema:  3+  Patient is without complaints other than noted in her HPI. All questions were answered.  All lab and sonogram results have been reviewed. Comments: abnormal: EICF   Assessment:  1.  Pregnancy at [redacted]w[redacted]d,  Estimated Date of Delivery: 08/04/15 :                          2.  Chronic hypertension                        3.  LV EICF with normal materal IT  Medication(s) Plans:  Cont labetalol 200 BID  Treatment Plan:  Twice weekly surveillance, sono alt with NST, induce at 39 weeks or as indicated  Return in about 4 days (around 07/15/2015) for BPP/sono, HROB. for appointment for high risk OB care  No orders of the defined types were placed in this encounter.   Orders Placed This Encounter  Procedures  . GC/Chlamydia Probe Amp  . Culture, beta strep (group b only)  . US Fetal BPP W/O Non Stress  . [place] UA Cord Doppler  . POCT urinalysis dipstick

## 2015-07-12 LAB — GC/CHLAMYDIA PROBE AMP
CHLAMYDIA, DNA PROBE: NEGATIVE
NEISSERIA GONORRHOEAE BY PCR: NEGATIVE

## 2015-07-14 ENCOUNTER — Ambulatory Visit (INDEPENDENT_AMBULATORY_CARE_PROVIDER_SITE_OTHER): Payer: BLUE CROSS/BLUE SHIELD | Admitting: Obstetrics & Gynecology

## 2015-07-14 ENCOUNTER — Ambulatory Visit (INDEPENDENT_AMBULATORY_CARE_PROVIDER_SITE_OTHER): Payer: BLUE CROSS/BLUE SHIELD

## 2015-07-14 ENCOUNTER — Encounter: Payer: Self-pay | Admitting: Obstetrics & Gynecology

## 2015-07-14 VITALS — BP 140/80 | HR 80 | Wt 341.0 lb

## 2015-07-14 DIAGNOSIS — O10912 Unspecified pre-existing hypertension complicating pregnancy, second trimester: Secondary | ICD-10-CM | POA: Diagnosis not present

## 2015-07-14 DIAGNOSIS — Z331 Pregnant state, incidental: Secondary | ICD-10-CM

## 2015-07-14 DIAGNOSIS — O09893 Supervision of other high risk pregnancies, third trimester: Secondary | ICD-10-CM

## 2015-07-14 DIAGNOSIS — O10919 Unspecified pre-existing hypertension complicating pregnancy, unspecified trimester: Secondary | ICD-10-CM

## 2015-07-14 DIAGNOSIS — IMO0001 Reserved for inherently not codable concepts without codable children: Secondary | ICD-10-CM

## 2015-07-14 DIAGNOSIS — Z1389 Encounter for screening for other disorder: Secondary | ICD-10-CM

## 2015-07-14 DIAGNOSIS — O358XX1 Maternal care for other (suspected) fetal abnormality and damage, fetus 1: Secondary | ICD-10-CM

## 2015-07-14 LAB — POCT URINALYSIS DIPSTICK
Blood, UA: NEGATIVE
Glucose, UA: NEGATIVE
KETONES UA: NEGATIVE
Leukocytes, UA: NEGATIVE
Nitrite, UA: NEGATIVE

## 2015-07-14 NOTE — Progress Notes (Signed)
[place] 37wks,cephalic,BPP [id number] pl gr 3,RI .69,.73,afi 13.5,fhr 127 bpm,efw 3052g 50%

## 2015-07-14 NOTE — Progress Notes (Signed)
Fetal Surveillance Testing today:  Sonogram is normal with good fluid BPP 8/8 and Doppler flow is excellent   High Risk Pregnancy Diagnosis(es):   Chronic Hypertension  G1P0 [redacted]w[redacted]d Estimated Date of Delivery: 08/04/15  Blood pressure 140/80, pulse 80, weight 341 lb (154.677 kg), last menstrual period 11/02/2014.  Urinalysis: Negative   HPI: The patient is being seen today for ongoing management of chronic hypertension. Today she reports swelling and pelvic pressure   BP weight and urine results all reviewed and noted. Patient reports good fetal movement, denies any bleeding and no rupture of membranes symptoms or regular contractions.  Fundal Height:  U+38 Fetal Heart rate:  127 Edema:  3  Patient is without complaints other than noted in her HPI. All questions were answered.  All lab and sonogram results have been reviewed. Comments: abnormal:    Assessment:  1.  Pregnancy at [redacted]w[redacted]d,  Estimated Date of Delivery: 08/04/15 :                          2.  Chronic hypertension                        3.  Morbid obesity  Medication(s) Plans:  Labetalol 200 BID + baby ASA  Treatment Plan:  Twice weekly surveillance and induction at 39 weeks, unless otherwise clinically indicated  No Follow-up on file. for appointment for high risk OB care  No orders of the defined types were placed in this encounter.   Orders Placed This Encounter  Procedures  . POCT urinalysis dipstick

## 2015-07-16 LAB — CULTURE, BETA STREP (GROUP B ONLY): Strep Gp B Culture: NEGATIVE

## 2015-07-17 ENCOUNTER — Encounter: Payer: Self-pay | Admitting: Obstetrics & Gynecology

## 2015-07-17 ENCOUNTER — Ambulatory Visit (INDEPENDENT_AMBULATORY_CARE_PROVIDER_SITE_OTHER): Payer: BLUE CROSS/BLUE SHIELD | Admitting: Obstetrics & Gynecology

## 2015-07-17 VITALS — BP 140/90 | HR 72 | Wt 339.0 lb

## 2015-07-17 DIAGNOSIS — O10919 Unspecified pre-existing hypertension complicating pregnancy, unspecified trimester: Secondary | ICD-10-CM

## 2015-07-17 DIAGNOSIS — O09893 Supervision of other high risk pregnancies, third trimester: Secondary | ICD-10-CM

## 2015-07-17 DIAGNOSIS — O10912 Unspecified pre-existing hypertension complicating pregnancy, second trimester: Secondary | ICD-10-CM | POA: Diagnosis not present

## 2015-07-17 DIAGNOSIS — O358XX1 Maternal care for other (suspected) fetal abnormality and damage, fetus 1: Secondary | ICD-10-CM

## 2015-07-17 DIAGNOSIS — Z1389 Encounter for screening for other disorder: Secondary | ICD-10-CM

## 2015-07-17 DIAGNOSIS — Z331 Pregnant state, incidental: Secondary | ICD-10-CM

## 2015-07-17 DIAGNOSIS — IMO0001 Reserved for inherently not codable concepts without codable children: Secondary | ICD-10-CM

## 2015-07-17 LAB — POCT URINALYSIS DIPSTICK
GLUCOSE UA: NEGATIVE
Ketones, UA: NEGATIVE
Leukocytes, UA: NEGATIVE
NITRITE UA: NEGATIVE
Protein, UA: 1
RBC UA: NEGATIVE

## 2015-07-17 NOTE — Progress Notes (Signed)
Fetal Surveillance Testing today:  Reactive NST   High Risk Pregnancy Diagnosis(es):   Chronic hypertension  G1P0 [redacted]w[redacted]d Estimated Date of Delivery: 08/04/15  Blood pressure 140/90, pulse 72, weight 339 lb (153.769 kg), last menstrual period 11/02/2014.  Urinalysis: Positive for 1+ protein   HPI: The patient is being seen today for ongoing management of chronic hypertension. Today she reports back pelvic pressure   BP weight and urine results all reviewed and noted. Patient reports good fetal movement, denies any bleeding and no rupture of membranes symptoms or regular contractions.  Fundal Height:  Umbilicus+38 Fetal Heart rate:  135 Edema:  3+ edema  Patient is without complaints other than noted in her HPI. All questions were answered.  All lab and sonogram results have been reviewed. Comments: abnormal:    Assessment:  1.  Pregnancy at [redacted]w[redacted]d,  Estimated Date of Delivery: 08/04/15 :                          2.  Chronic hypertension                        3.  LV EICF  Medication(s) Plans:  Continue labetalol 200 BID + baby ASA  Treatment Plan:  No changes, sono/nst next week, induction the following Monday, 39 weeks unless otherwise indicated  Return in about 4 days (around 07/21/2015) for BPP/sono, HROB, with Dr Elonda Husky. for appointment for high risk OB care  No orders of the defined types were placed in this encounter.   Orders Placed This Encounter  Procedures  . US Fetal BPP W/O Non Stress  . [place] UA Cord Doppler  . POCT urinalysis dipstick

## 2015-07-21 ENCOUNTER — Telehealth (HOSPITAL_COMMUNITY): Payer: Self-pay | Admitting: *Deleted

## 2015-07-21 ENCOUNTER — Encounter: Payer: Self-pay | Admitting: Obstetrics & Gynecology

## 2015-07-21 ENCOUNTER — Ambulatory Visit (INDEPENDENT_AMBULATORY_CARE_PROVIDER_SITE_OTHER): Payer: BLUE CROSS/BLUE SHIELD | Admitting: Obstetrics & Gynecology

## 2015-07-21 ENCOUNTER — Ambulatory Visit (INDEPENDENT_AMBULATORY_CARE_PROVIDER_SITE_OTHER): Payer: BLUE CROSS/BLUE SHIELD

## 2015-07-21 VITALS — BP 120/80 | HR 72 | Wt 338.0 lb

## 2015-07-21 DIAGNOSIS — O358XX1 Maternal care for other (suspected) fetal abnormality and damage, fetus 1: Secondary | ICD-10-CM

## 2015-07-21 DIAGNOSIS — O10912 Unspecified pre-existing hypertension complicating pregnancy, second trimester: Secondary | ICD-10-CM

## 2015-07-21 DIAGNOSIS — Z1389 Encounter for screening for other disorder: Secondary | ICD-10-CM

## 2015-07-21 DIAGNOSIS — O09893 Supervision of other high risk pregnancies, third trimester: Secondary | ICD-10-CM

## 2015-07-21 DIAGNOSIS — O10919 Unspecified pre-existing hypertension complicating pregnancy, unspecified trimester: Secondary | ICD-10-CM

## 2015-07-21 DIAGNOSIS — O10913 Unspecified pre-existing hypertension complicating pregnancy, third trimester: Secondary | ICD-10-CM | POA: Diagnosis not present

## 2015-07-21 DIAGNOSIS — O98513 Other viral diseases complicating pregnancy, third trimester: Principal | ICD-10-CM

## 2015-07-21 DIAGNOSIS — Z3A38 38 weeks gestation of pregnancy: Secondary | ICD-10-CM

## 2015-07-21 DIAGNOSIS — Z331 Pregnant state, incidental: Secondary | ICD-10-CM

## 2015-07-21 DIAGNOSIS — B009 Herpesviral infection, unspecified: Secondary | ICD-10-CM

## 2015-07-21 DIAGNOSIS — IMO0001 Reserved for inherently not codable concepts without codable children: Secondary | ICD-10-CM

## 2015-07-21 LAB — POCT URINALYSIS DIPSTICK
GLUCOSE UA: NEGATIVE
Ketones, UA: NEGATIVE
Leukocytes, UA: NEGATIVE
NITRITE UA: NEGATIVE
Protein, UA: NEGATIVE
RBC UA: NEGATIVE

## 2015-07-21 NOTE — Progress Notes (Signed)
[place] 38 wks,cephalic,post pl gr 3,fhr 143 bpm,BPP 8/8,RI .75,.77,AFI 8.4cm

## 2015-07-21 NOTE — Progress Notes (Signed)
Fetal Surveillance Testing today:  Sonogram BPP 8/8, fluid good Dopplers are stable with excellent diastolic flow   High Risk Pregnancy Diagnosis(es):   Chronic hypertension  G1P0 [redacted]w[redacted]d Estimated Date of Delivery: 08/04/15  Blood pressure 120/80, pulse 72, weight 338 lb (153.316 kg), last menstrual period 11/02/2014.  Urinalysis: Negative   HPI: The patient is being seen today for ongoing management of chronic hypertension. Today she reports swelling same   BP weight and urine results all reviewed and noted. Patient reports good fetal movement, denies any bleeding and no rupture of membranes symptoms or regular contractions.  Fundal Height:  Umbilicus + 40 Fetal Heart rate:  143 Edema:  3+ stable  Patient is without complaints other than noted in her HPI. All questions were answered.  All lab and sonogram results have been reviewed. Comments:    Assessment:  1.  Pregnancy at [redacted]w[redacted]d,  Estimated Date of Delivery: 08/04/15 :                          2.  Chronic hypertension                        3.  LV EICF isolated finding with normal IT  Medication(s) Plans:  Labetalol 200 BID the whole pregnancy  Treatment Plan:  induction 07/28/2015  Return in about 2 weeks (around 08/07/2015) for Follow up, with Dr Elonda Husky. for appointment for high risk OB care  No orders of the defined types were placed in this encounter.   Orders Placed This Encounter  Procedures  . POCT urinalysis dipstick

## 2015-07-21 NOTE — Telephone Encounter (Signed)
Preadmission screen  

## 2015-07-22 ENCOUNTER — Telehealth (HOSPITAL_COMMUNITY): Payer: Self-pay | Admitting: *Deleted

## 2015-07-22 ENCOUNTER — Encounter (HOSPITAL_COMMUNITY): Payer: Self-pay | Admitting: *Deleted

## 2015-07-22 NOTE — Telephone Encounter (Signed)
Preadmission screen  

## 2015-07-28 ENCOUNTER — Inpatient Hospital Stay (HOSPITAL_COMMUNITY): Payer: BLUE CROSS/BLUE SHIELD

## 2015-07-28 ENCOUNTER — Encounter (HOSPITAL_COMMUNITY)
Admission: RE | Disposition: A | Discharge status: Home / Self Care | Payer: Self-pay | Source: Ambulatory Visit | Attending: Family Medicine

## 2015-07-28 ENCOUNTER — Inpatient Hospital Stay (HOSPITAL_COMMUNITY)
Admission: RE | Admit: 2015-07-28 | Discharge: 2015-07-30 | DRG: 765 | Disposition: A | Discharge status: Home / Self Care | Payer: BLUE CROSS/BLUE SHIELD | Source: Ambulatory Visit | Attending: Family Medicine | Admitting: Family Medicine

## 2015-07-28 ENCOUNTER — Inpatient Hospital Stay (HOSPITAL_COMMUNITY): Payer: BLUE CROSS/BLUE SHIELD | Admitting: Anesthesiology

## 2015-07-28 ENCOUNTER — Encounter (HOSPITAL_COMMUNITY): Payer: Self-pay

## 2015-07-28 DIAGNOSIS — D259 Leiomyoma of uterus, unspecified: Secondary | ICD-10-CM | POA: Diagnosis present

## 2015-07-28 DIAGNOSIS — Z98891 History of uterine scar from previous surgery: Secondary | ICD-10-CM

## 2015-07-28 DIAGNOSIS — O9852 Other viral diseases complicating childbirth: Secondary | ICD-10-CM | POA: Diagnosis present

## 2015-07-28 DIAGNOSIS — B009 Herpesviral infection, unspecified: Secondary | ICD-10-CM | POA: Diagnosis present

## 2015-07-28 DIAGNOSIS — K219 Gastro-esophageal reflux disease without esophagitis: Secondary | ICD-10-CM | POA: Diagnosis present

## 2015-07-28 DIAGNOSIS — Z7982 Long term (current) use of aspirin: Secondary | ICD-10-CM

## 2015-07-28 DIAGNOSIS — O9902 Anemia complicating childbirth: Secondary | ICD-10-CM | POA: Diagnosis present

## 2015-07-28 DIAGNOSIS — O98513 Other viral diseases complicating pregnancy, third trimester: Secondary | ICD-10-CM

## 2015-07-28 DIAGNOSIS — E669 Obesity, unspecified: Secondary | ICD-10-CM | POA: Diagnosis present

## 2015-07-28 DIAGNOSIS — Z8349 Family history of other endocrine, nutritional and metabolic diseases: Secondary | ICD-10-CM | POA: Diagnosis not present

## 2015-07-28 DIAGNOSIS — Z3A39 39 weeks gestation of pregnancy: Secondary | ICD-10-CM

## 2015-07-28 DIAGNOSIS — A599 Trichomoniasis, unspecified: Secondary | ICD-10-CM | POA: Diagnosis present

## 2015-07-28 DIAGNOSIS — O9832 Other infections with a predominantly sexual mode of transmission complicating childbirth: Secondary | ICD-10-CM | POA: Diagnosis present

## 2015-07-28 DIAGNOSIS — O3410 Maternal care for benign tumor of corpus uteri, unspecified trimester: Secondary | ICD-10-CM

## 2015-07-28 DIAGNOSIS — O1092 Unspecified pre-existing hypertension complicating childbirth: Principal | ICD-10-CM | POA: Diagnosis present

## 2015-07-28 DIAGNOSIS — O10919 Unspecified pre-existing hypertension complicating pregnancy, unspecified trimester: Secondary | ICD-10-CM | POA: Diagnosis present

## 2015-07-28 DIAGNOSIS — O9962 Diseases of the digestive system complicating childbirth: Secondary | ICD-10-CM | POA: Diagnosis present

## 2015-07-28 DIAGNOSIS — O9812 Syphilis complicating childbirth: Secondary | ICD-10-CM | POA: Diagnosis present

## 2015-07-28 DIAGNOSIS — O0990 Supervision of high risk pregnancy, unspecified, unspecified trimester: Secondary | ICD-10-CM

## 2015-07-28 DIAGNOSIS — Z6841 Body Mass Index (BMI) 40.0 and over, adult: Secondary | ICD-10-CM

## 2015-07-28 DIAGNOSIS — O3413 Maternal care for benign tumor of corpus uteri, third trimester: Secondary | ICD-10-CM | POA: Diagnosis present

## 2015-07-28 DIAGNOSIS — Z87891 Personal history of nicotine dependence: Secondary | ICD-10-CM

## 2015-07-28 DIAGNOSIS — O99214 Obesity complicating childbirth: Secondary | ICD-10-CM

## 2015-07-28 DIAGNOSIS — A5131 Condyloma latum: Secondary | ICD-10-CM | POA: Diagnosis present

## 2015-07-28 DIAGNOSIS — Z809 Family history of malignant neoplasm, unspecified: Secondary | ICD-10-CM | POA: Diagnosis not present

## 2015-07-28 DIAGNOSIS — Z09 Encounter for follow-up examination after completed treatment for conditions other than malignant neoplasm: Secondary | ICD-10-CM

## 2015-07-28 DIAGNOSIS — O09899 Supervision of other high risk pregnancies, unspecified trimester: Secondary | ICD-10-CM

## 2015-07-28 LAB — RPR: RPR: NONREACTIVE

## 2015-07-28 LAB — COMPREHENSIVE METABOLIC PANEL
ALBUMIN: 3 g/dL — AB (ref 3.5–5.0)
ALK PHOS: 112 U/L (ref 38–126)
ALT: 17 U/L (ref 14–54)
AST: 26 U/L (ref 15–41)
Anion gap: 11 (ref 5–15)
BILIRUBIN TOTAL: 0.4 mg/dL (ref 0.3–1.2)
BUN: 11 mg/dL (ref 6–20)
CO2: 18 mmol/L — ABNORMAL LOW (ref 22–32)
CREATININE: 0.85 mg/dL (ref 0.44–1.00)
Calcium: 9.2 mg/dL (ref 8.9–10.3)
Chloride: 106 mmol/L (ref 101–111)
GFR calc Af Amer: >60 mL/min (ref >60)
GLUCOSE: 81 mg/dL (ref 65–99)
POTASSIUM: 4.3 mmol/L (ref 3.5–5.1)
Sodium: 135 mmol/L (ref 135–145)
TOTAL PROTEIN: 6.7 g/dL (ref 6.5–8.1)

## 2015-07-28 LAB — TYPE AND SCREEN
ABO/RH(D): A POS
Antibody Screen: NEGATIVE

## 2015-07-28 LAB — ABO/RH: ABO/RH(D): A POS

## 2015-07-28 LAB — CBC
HCT: 29.9 % — ABNORMAL LOW (ref 36.0–46.0)
Hemoglobin: 9.7 g/dL — ABNORMAL LOW (ref 12.0–15.0)
MCH: 26.5 pg (ref 26.0–34.0)
MCHC: 32.4 g/dL (ref 30.0–36.0)
MCV: 81.7 fL (ref 78.0–100.0)
PLATELETS: 168 10*3/uL (ref 150–400)
RBC: 3.66 MIL/uL — AB (ref 3.87–5.11)
RDW: 15.8 % — AB (ref 11.5–15.5)
WBC: 8.3 10*3/uL (ref 4.0–10.5)

## 2015-07-28 SURGERY — Surgical Case
Anesthesia: Spinal | Site: Abdomen

## 2015-07-28 MED ORDER — AMLODIPINE BESYLATE 10 MG PO TABS
10.0000 mg | ORAL_TABLET | Freq: Every day | ORAL | Status: DC
  Administered 2015-07-29 – 2015-07-30 (×3): 10 mg via ORAL
  Filled 2015-07-28 (×5): qty 1

## 2015-07-28 MED ORDER — ACETAMINOPHEN 325 MG PO TABS: 650.0000 mg | ORAL_TABLET | ORAL | Status: DC | PRN

## 2015-07-28 MED ORDER — OXYTOCIN BOLUS FROM INFUSION: 500.0000 mL | INTRAVENOUS | Status: DC

## 2015-07-28 MED ORDER — SODIUM CHLORIDE 0.9 % IR SOLN
Status: DC | PRN
  Administered 2015-07-28: 1000 mL

## 2015-07-28 MED ORDER — SODIUM CHLORIDE 0.9 % IJ SOLN: 3.0000 mL | INTRAMUSCULAR | Status: DC | PRN

## 2015-07-28 MED ORDER — DEXTROSE 5 % IV SOLN
3.0000 g | INTRAVENOUS | Status: DC
  Filled 2015-07-28: qty 3000

## 2015-07-28 MED ORDER — OXYCODONE-ACETAMINOPHEN 5-325 MG PO TABS: 2.0000 | ORAL_TABLET | ORAL | Status: DC | PRN

## 2015-07-28 MED ORDER — OXYTOCIN 40 UNITS IN LACTATED RINGERS INFUSION - SIMPLE MED: 62.5000 mL/h | INTRAVENOUS | Status: AC

## 2015-07-28 MED ORDER — PRENATAL MULTIVITAMIN CH
1.0000 | ORAL_TABLET | Freq: Every day | ORAL | Status: DC
  Administered 2015-07-29 – 2015-07-30 (×2): 1 via ORAL
  Filled 2015-07-28 (×2): qty 1

## 2015-07-28 MED ORDER — LABETALOL HCL 200 MG PO TABS
200.0000 mg | ORAL_TABLET | Freq: Once | ORAL | Status: AC
  Administered 2015-07-28: 200 mg via ORAL
  Filled 2015-07-28: qty 1

## 2015-07-28 MED ORDER — ONDANSETRON HCL 4 MG/2ML IJ SOLN: 4.0000 mg | Freq: Four times a day (QID) | INTRAMUSCULAR | Status: DC | PRN

## 2015-07-28 MED ORDER — PHENYLEPHRINE 8 MG IN D5W 100 ML (0.08MG/ML) PREMIX OPTIME
INJECTION | INTRAVENOUS | Status: DC | PRN
  Administered 2015-07-28: 60 ug/min via INTRAVENOUS

## 2015-07-28 MED ORDER — LABETALOL HCL 5 MG/ML IV SOLN
10.0000 mg | INTRAVENOUS | Status: DC | PRN
  Administered 2015-07-28: 10 mg via INTRAVENOUS
  Filled 2015-07-28: qty 4

## 2015-07-28 MED ORDER — OXYTOCIN 10 UNIT/ML IJ SOLN
40.0000 [IU] | INTRAVENOUS | Status: DC | PRN
  Administered 2015-07-28: 40 [IU] via INTRAVENOUS

## 2015-07-28 MED ORDER — MORPHINE SULFATE (PF) 0.5 MG/ML IJ SOLN
INTRAMUSCULAR | Status: DC | PRN
  Administered 2015-07-28: .15 mg via INTRATHECAL

## 2015-07-28 MED ORDER — OXYTOCIN 10 UNIT/ML IJ SOLN
INTRAMUSCULAR | Status: AC
  Filled 2015-07-28: qty 4

## 2015-07-28 MED ORDER — KETOROLAC TROMETHAMINE 30 MG/ML IJ SOLN
30.0000 mg | Freq: Four times a day (QID) | INTRAMUSCULAR | Status: DC | PRN
  Administered 2015-07-28: 30 mg via INTRAMUSCULAR

## 2015-07-28 MED ORDER — FLEET ENEMA 7-19 GM/118ML RE ENEM: 1.0000 | ENEMA | Freq: Every day | RECTAL | Status: DC | PRN

## 2015-07-28 MED ORDER — SCOPOLAMINE 1 MG/3DAYS TD PT72
MEDICATED_PATCH | TRANSDERMAL | Status: AC
  Filled 2015-07-28: qty 1

## 2015-07-28 MED ORDER — NALOXONE HCL 2 MG/2ML IJ SOSY
1.0000 ug/kg/h | PREFILLED_SYRINGE | INTRAVENOUS | Status: DC | PRN
  Filled 2015-07-28: qty 2

## 2015-07-28 MED ORDER — SIMETHICONE 80 MG PO CHEW
80.0000 mg | CHEWABLE_TABLET | Freq: Three times a day (TID) | ORAL | Status: DC
  Administered 2015-07-28 – 2015-07-30 (×4): 80 mg via ORAL
  Filled 2015-07-28 (×4): qty 1

## 2015-07-28 MED ORDER — ONDANSETRON HCL 4 MG/2ML IJ SOLN
INTRAMUSCULAR | Status: AC
  Filled 2015-07-28: qty 2

## 2015-07-28 MED ORDER — DEXTROSE 5 % IV SOLN
3.0000 g | INTRAVENOUS | Status: DC | PRN
  Administered 2015-07-28: 3 g via INTRAVENOUS

## 2015-07-28 MED ORDER — LANOLIN HYDROUS EX OINT: 1.0000 "application " | TOPICAL_OINTMENT | CUTANEOUS | Status: DC | PRN

## 2015-07-28 MED ORDER — DIPHENHYDRAMINE HCL 25 MG PO CAPS
25.0000 mg | ORAL_CAPSULE | Freq: Four times a day (QID) | ORAL | Status: DC | PRN
  Administered 2015-07-28 – 2015-07-29 (×2): 25 mg via ORAL
  Filled 2015-07-28 (×2): qty 1

## 2015-07-28 MED ORDER — ONDANSETRON HCL 4 MG/2ML IJ SOLN: 4.0000 mg | Freq: Three times a day (TID) | INTRAMUSCULAR | Status: DC | PRN

## 2015-07-28 MED ORDER — KETOROLAC TROMETHAMINE 30 MG/ML IJ SOLN
INTRAMUSCULAR | Status: AC
  Administered 2015-07-28: 30 mg via INTRAMUSCULAR
  Filled 2015-07-28: qty 1

## 2015-07-28 MED ORDER — DIPHENHYDRAMINE HCL 50 MG/ML IJ SOLN: 12.5000 mg | INTRAMUSCULAR | Status: DC | PRN

## 2015-07-28 MED ORDER — WITCH HAZEL-GLYCERIN EX PADS: 1.0000 "application " | MEDICATED_PAD | CUTANEOUS | Status: DC | PRN

## 2015-07-28 MED ORDER — ONDANSETRON HCL 4 MG/2ML IJ SOLN
INTRAMUSCULAR | Status: DC | PRN
  Administered 2015-07-28: 4 mg via INTRAVENOUS

## 2015-07-28 MED ORDER — LACTATED RINGERS IV SOLN
INTRAVENOUS | Status: DC | PRN
  Administered 2015-07-28: 12:00:00 via INTRAVENOUS

## 2015-07-28 MED ORDER — LACTATED RINGERS IV SOLN
500.0000 mL | INTRAVENOUS | Status: DC | PRN
  Administered 2015-07-28: 500 mL via INTRAVENOUS

## 2015-07-28 MED ORDER — LACTATED RINGERS IV SOLN: 125.0000 mL/h | INTRAVENOUS | Status: DC

## 2015-07-28 MED ORDER — LACTATED RINGERS IV SOLN
INTRAVENOUS | Status: DC | PRN
  Administered 2015-07-28 (×2): via INTRAVENOUS

## 2015-07-28 MED ORDER — NALBUPHINE HCL 10 MG/ML IJ SOLN: 5.0000 mg | INTRAMUSCULAR | Status: DC | PRN

## 2015-07-28 MED ORDER — DIPHENHYDRAMINE HCL 25 MG PO CAPS
25.0000 mg | ORAL_CAPSULE | ORAL | Status: DC | PRN
  Filled 2015-07-28: qty 1

## 2015-07-28 MED ORDER — FENTANYL CITRATE (PF) 100 MCG/2ML IJ SOLN: 25.0000 ug | INTRAMUSCULAR | Status: DC | PRN

## 2015-07-28 MED ORDER — PANTOPRAZOLE SODIUM 40 MG PO TBEC: 40.0000 mg | DELAYED_RELEASE_TABLET | Freq: Every day | ORAL | Status: DC

## 2015-07-28 MED ORDER — ACYCLOVIR 400 MG PO TABS
400.0000 mg | ORAL_TABLET | Freq: Every day | ORAL | Status: DC
  Administered 2015-07-28: 400 mg via ORAL
  Filled 2015-07-28 (×8): qty 1

## 2015-07-28 MED ORDER — IBUPROFEN 600 MG PO TABS
600.0000 mg | ORAL_TABLET | Freq: Four times a day (QID) | ORAL | Status: DC
  Administered 2015-07-28 – 2015-07-30 (×7): 600 mg via ORAL
  Filled 2015-07-28 (×7): qty 1

## 2015-07-28 MED ORDER — TERBUTALINE SULFATE 1 MG/ML IJ SOLN: 0.2500 mg | Freq: Once | INTRAMUSCULAR | Status: DC | PRN

## 2015-07-28 MED ORDER — MENTHOL 3 MG MT LOZG: 1.0000 | LOZENGE | OROMUCOSAL | Status: DC | PRN

## 2015-07-28 MED ORDER — LABETALOL HCL 200 MG PO TABS
200.0000 mg | ORAL_TABLET | Freq: Two times a day (BID) | ORAL | Status: DC
  Filled 2015-07-28 (×4): qty 1

## 2015-07-28 MED ORDER — SIMETHICONE 80 MG PO CHEW: 80.0000 mg | CHEWABLE_TABLET | ORAL | Status: DC | PRN

## 2015-07-28 MED ORDER — ZOLPIDEM TARTRATE 5 MG PO TABS: 5.0000 mg | ORAL_TABLET | Freq: Every evening | ORAL | Status: DC | PRN

## 2015-07-28 MED ORDER — MISOPROSTOL 25 MCG QUARTER TABLET
25.0000 ug | ORAL_TABLET | ORAL | Status: DC | PRN
  Administered 2015-07-28: 25 ug via VAGINAL
  Filled 2015-07-28 (×2): qty 0.25

## 2015-07-28 MED ORDER — IBUPROFEN 600 MG PO TABS: 600.0000 mg | ORAL_TABLET | Freq: Four times a day (QID) | ORAL | Status: DC | PRN

## 2015-07-28 MED ORDER — SIMETHICONE 80 MG PO CHEW
80.0000 mg | CHEWABLE_TABLET | ORAL | Status: DC
  Administered 2015-07-29: 80 mg via ORAL
  Filled 2015-07-28: qty 1

## 2015-07-28 MED ORDER — NALBUPHINE HCL 10 MG/ML IJ SOLN: 5.0000 mg | Freq: Once | INTRAMUSCULAR | Status: DC | PRN

## 2015-07-28 MED ORDER — FENTANYL CITRATE (PF) 100 MCG/2ML IJ SOLN
INTRAMUSCULAR | Status: AC
  Filled 2015-07-28: qty 2

## 2015-07-28 MED ORDER — FENTANYL CITRATE (PF) 100 MCG/2ML IJ SOLN
INTRAMUSCULAR | Status: DC | PRN
  Administered 2015-07-28: 25 ug via INTRATHECAL

## 2015-07-28 MED ORDER — BUPIVACAINE IN DEXTROSE 0.75-8.25 % IT SOLN
INTRATHECAL | Status: DC | PRN
  Administered 2015-07-28: 2 mL via INTRATHECAL

## 2015-07-28 MED ORDER — OXYTOCIN 40 UNITS IN LACTATED RINGERS INFUSION - SIMPLE MED: 62.5000 mL/h | INTRAVENOUS | Status: DC

## 2015-07-28 MED ORDER — TETANUS-DIPHTH-ACELL PERTUSSIS 5-2.5-18.5 LF-MCG/0.5 IM SUSP: 0.5000 mL | Freq: Once | INTRAMUSCULAR | Status: DC

## 2015-07-28 MED ORDER — MORPHINE SULFATE (PF) 0.5 MG/ML IJ SOLN
INTRAMUSCULAR | Status: AC
  Filled 2015-07-28: qty 10

## 2015-07-28 MED ORDER — CITRIC ACID-SODIUM CITRATE 334-500 MG/5ML PO SOLN
30.0000 mL | ORAL | Status: DC | PRN
  Administered 2015-07-28: 30 mL via ORAL
  Filled 2015-07-28: qty 15

## 2015-07-28 MED ORDER — SENNOSIDES-DOCUSATE SODIUM 8.6-50 MG PO TABS
2.0000 | ORAL_TABLET | ORAL | Status: DC
  Administered 2015-07-29: 2 via ORAL
  Filled 2015-07-28: qty 2

## 2015-07-28 MED ORDER — DEXTROSE 5 % IV SOLN
INTRAVENOUS | Status: AC
  Filled 2015-07-28: qty 3000

## 2015-07-28 MED ORDER — NALOXONE HCL 0.4 MG/ML IJ SOLN: 0.4000 mg | INTRAMUSCULAR | Status: DC | PRN

## 2015-07-28 MED ORDER — MEPERIDINE HCL 25 MG/ML IJ SOLN: 6.2500 mg | INTRAMUSCULAR | Status: DC | PRN

## 2015-07-28 MED ORDER — PHENYLEPHRINE 8 MG IN D5W 100 ML (0.08MG/ML) PREMIX OPTIME
INJECTION | INTRAVENOUS | Status: AC
  Filled 2015-07-28: qty 100

## 2015-07-28 MED ORDER — DIBUCAINE 1 % RE OINT: 1.0000 "application " | TOPICAL_OINTMENT | RECTAL | Status: DC | PRN

## 2015-07-28 MED ORDER — SCOPOLAMINE 1 MG/3DAYS TD PT72
MEDICATED_PATCH | TRANSDERMAL | Status: DC | PRN
  Administered 2015-07-28: 1 via TRANSDERMAL

## 2015-07-28 MED ORDER — KETOROLAC TROMETHAMINE 30 MG/ML IJ SOLN
30.0000 mg | Freq: Four times a day (QID) | INTRAMUSCULAR | Status: DC | PRN
Start: 2015-07-28 — End: 2015-07-28

## 2015-07-28 MED ORDER — LIDOCAINE HCL (PF) 1 % IJ SOLN: 30.0000 mL | INTRAMUSCULAR | Status: DC | PRN

## 2015-07-28 MED ORDER — OXYCODONE-ACETAMINOPHEN 5-325 MG PO TABS: 1.0000 | ORAL_TABLET | ORAL | Status: DC | PRN

## 2015-07-28 MED ORDER — LACTATED RINGERS IV SOLN
INTRAVENOUS | Status: DC
Start: 2015-07-28 — End: 2015-07-30
  Administered 2015-07-28: 20:00:00 via INTRAVENOUS

## 2015-07-28 MED ORDER — LACTATED RINGERS IV SOLN
INTRAVENOUS | Status: DC
  Administered 2015-07-28: 08:00:00 via INTRAVENOUS
  Administered 2015-07-28: 125 mL/h via INTRAVENOUS

## 2015-07-28 SURGICAL SUPPLY — 38 items
BENZOIN TINCTURE PRP APPL 2/3 (GAUZE/BANDAGES/DRESSINGS) ×3 IMPLANT
CATH ROBINSON RED A/P 16FR (CATHETERS) IMPLANT
CLAMP CORD UMBIL (MISCELLANEOUS) IMPLANT
CLOSURE STERI STRIP 1/2 X4 (GAUZE/BANDAGES/DRESSINGS) ×2 IMPLANT
CLOSURE WOUND 1/2 X4 (GAUZE/BANDAGES/DRESSINGS) ×1
CLOTH BEACON ORANGE TIMEOUT ST (SAFETY) ×3 IMPLANT
DRAPE SHEET LG 3/4 BI-LAMINATE (DRAPES) IMPLANT
DRSG OPSITE POSTOP 4X10 (GAUZE/BANDAGES/DRESSINGS) ×3 IMPLANT
DURAPREP 26ML APPLICATOR (WOUND CARE) ×3 IMPLANT
ELECT REM PT RETURN 9FT ADLT (ELECTROSURGICAL) ×3
ELECTRODE REM PT RTRN 9FT ADLT (ELECTROSURGICAL) ×1 IMPLANT
EXTRACTOR VACUUM KIWI (MISCELLANEOUS) ×3 IMPLANT
EXTRACTOR VACUUM M CUP 4 TUBE (SUCTIONS) IMPLANT
EXTRACTOR VACUUM M CUP 4' TUBE (SUCTIONS)
GLOVE BIOGEL PI IND STRL 7.0 (GLOVE) ×1 IMPLANT
GLOVE BIOGEL PI IND STRL 7.5 (GLOVE) ×2 IMPLANT
GLOVE BIOGEL PI INDICATOR 7.0 (GLOVE) ×2
GLOVE BIOGEL PI INDICATOR 7.5 (GLOVE) ×4
GLOVE ECLIPSE 7.5 STRL STRAW (GLOVE) ×3 IMPLANT
GOWN STRL REUS W/TWL LRG LVL3 (GOWN DISPOSABLE) ×9 IMPLANT
HOVERMATT SINGLE USE (MISCELLANEOUS) ×3 IMPLANT
KIT ABG SYR 3ML LUER SLIP (SYRINGE) IMPLANT
NEEDLE HYPO 25X5/8 SAFETYGLIDE (NEEDLE) IMPLANT
NS IRRIG 1000ML POUR BTL (IV SOLUTION) ×3 IMPLANT
PACK C SECTION WH (CUSTOM PROCEDURE TRAY) ×3 IMPLANT
PAD OB MATERNITY 4.3X12.25 (PERSONAL CARE ITEMS) ×3 IMPLANT
PENCIL SMOKE EVAC W/HOLSTER (ELECTROSURGICAL) ×3 IMPLANT
RTRCTR C-SECT PINK 25CM LRG (MISCELLANEOUS) ×3 IMPLANT
STRIP CLOSURE SKIN 1/2X4 (GAUZE/BANDAGES/DRESSINGS) ×2 IMPLANT
SUT MNCRL 0 VIOLET CTX 36 (SUTURE) IMPLANT
SUT MONOCRYL 0 CTX 36 (SUTURE)
SUT VIC AB 0 CTX 36 (SUTURE) ×6
SUT VIC AB 0 CTX36XBRD ANBCTRL (SUTURE) ×3 IMPLANT
SUT VIC AB 2-0 CT1 27 (SUTURE) ×2
SUT VIC AB 2-0 CT1 TAPERPNT 27 (SUTURE) ×1 IMPLANT
SUT VIC AB 4-0 KS 27 (SUTURE) ×3 IMPLANT
TOWEL OR 17X24 6PK STRL BLUE (TOWEL DISPOSABLE) ×3 IMPLANT
TRAY FOLEY CATH SILVER 14FR (SET/KITS/TRAYS/PACK) ×3 IMPLANT

## 2015-07-28 NOTE — Lactation Note (Signed)
This note was copied from the chart of Boy Adiyah Hemann. Lactation Consultation Note  Patient Name: Boy Anahi Sparby [id number] Date: 07/28/2015 Reason for consult: Initial assessment   With this mom of a term baby, now 3 hours old. The baby had been in the CNS to warm up under warmer, and know was back with mom, and breastfeeding well. He latched eagerly, has strong, rhythmic suckles and visible swallows. Mom has easily expressed colostrum. Basic breastfeeding and lactation teaching done. Mom knows to call for questions/concerns.    Maternal Data Formula Feeding for Exclusion: No Has patient been taught Hand Expression?: Yes Does the patient have breastfeeding experience prior to this delivery?: No  Feeding Feeding Type: Breast Fed Length of feed: 15 min (fed from both breasts)  LATCH Score/Interventions Latch: Grasps breast easily, tongue down, lips flanged, rhythmical sucking. Intervention(s): Adjust position;Assist with latch;Breast compression  Audible Swallowing: Spontaneous and intermittent  Type of Nipple: Everted at rest and after stimulation  Comfort (Breast/Nipple): Soft / non-tender     Hold (Positioning): Assistance needed to correctly position infant at breast and maintain latch. Intervention(s): Breastfeeding basics reviewed;Support Pillows;Position options;Skin to skin  LATCH Score: 9  Lactation Tools Discussed/Used     Consult Status Consult Status: Follow-up Date: 07/29/15 Follow-up type: In-patient    Tonna Corner 07/28/2015, 10:51 PM

## 2015-07-28 NOTE — Progress Notes (Signed)
Continuous FHR surveillance interrupted, RN to bedside, EFM adjusted.  

## 2015-07-28 NOTE — Progress Notes (Signed)
Patient ID: Olivia Brooks, female   DOB: Apr 12, 1981, 34 y.o.   MRN: [id number]  Called to patient's room due to repetitive late decelerations with every contraction.  She had this with cytotec earlier. Foley balloon currently in place.  Due to repetitive late delerations for more than 2 hours in latent stage of labor, I discussed the risks of cesarean section discussed with the patient included but were not limited to: bleeding which may require transfusion or reoperation; infection which may require antibiotics; injury to bowel, bladder, ureters or other surrounding organs; injury to the fetus; need for additional procedures including hysterectomy in the event of a life-threatening hemorrhage; placental abnormalities wth subsequent pregnancies, incisional problems, thromboembolic phenomenon and other postoperative/anesthesia complications. The patient concurred with the proposed plan, giving informed written consent for the procedure.   Patient has been NPO since last night she will remain NPO for procedure. Anesthesia and OR aware.  Preoperative prophylactic Ancef ordered on call to the OR.  To OR when ready.  Truett Mainland, DO 07/28/2015 11:33 AM

## 2015-07-28 NOTE — Consult Note (Signed)
[facility]  Delivery Note:  C-section       07/28/2015  12:13 PM  I was called to the operating room at the request of the patient's obstetrician (Dr. Nehemiah Settle) for a primary c-section.  PRENATAL HX: 34 y/o G1P0 admitted this morning for IOL at 39 weeks.  Her pregnancy was complicated by chronic hypertension for which she was on Labetalol.  There was also an isolated echogenic intracardiac focus. The fetus began to have late decelerations with every contraction, so she was take for c-section.  Decelerations to 60s noted in OR, improved to 80s just before c-section.   DELIVERY:  Infant was vigorous at delivery, requiring no resuscitation other than standard warming, drying and stimulation.  APGARs 8 and 9.  Exam within normal limits.  After 5 minutes, baby left with nurse to assist parents with skin-to-skin care.   _____________________ Electronically Signed By: Clinton Gallant, MD Neonatologist

## 2015-07-28 NOTE — Anesthesia Preprocedure Evaluation (Addendum)
Anesthesia Evaluation  Patient identified by MRN, date of birth, ID band Patient awake    Reviewed: Allergy & Precautions, H&P , NPO status , Patient's Chart, lab work & pertinent test results  Airway Mallampati: III  TM Distance: >3 FB Neck ROM: full    Dental no notable dental hx. (+) Teeth Intact   Pulmonary former smoker,    Pulmonary exam normal        Cardiovascular hypertension, Pt. on medications Normal cardiovascular exam Rhythm:Regular Rate:Normal     Neuro/Psych negative neurological ROS  negative psych ROS   GI/Hepatic Neg liver ROS, GERD  ,  Endo/Other  Morbid obesity  Renal/GU negative Renal ROS     Musculoskeletal negative musculoskeletal ROS (+)   Abdominal (+) + obese,   Peds  Hematology negative hematology ROS (+) anemia ,   Anesthesia Other Findings   Reproductive/Obstetrics (+) Pregnancy Non reassuring FHR tracing. Pre eclampsia.                            Anesthesia Physical Anesthesia Plan  ASA: III  Anesthesia Plan: Spinal   Post-op Pain Management:    Induction:   Airway Management Planned:   Additional Equipment:   Intra-op Plan:   Post-operative Plan:   Informed Consent: I have reviewed the patients History and Physical, chart, labs and discussed the procedure including the risks, benefits and alternatives for the proposed anesthesia with the patient or authorized representative who has indicated his/her understanding and acceptance.     Plan Discussed with: CRNA  Anesthesia Plan Comments:         Anesthesia Quick Evaluation

## 2015-07-28 NOTE — Progress Notes (Signed)
Pt ambulated to BR safely, Cat II surveillance noted prior to removal of EFM.

## 2015-07-28 NOTE — Op Note (Addendum)
Olivia Brooks PROCEDURE DATE: 07/28/2015  PREOPERATIVE DIAGNOSIS: Intrauterine pregnancy at  [redacted]w[redacted]d weeks gestation; non-reassuring fetal status  POSTOPERATIVE DIAGNOSIS: The same  PROCEDURE: Primary Low Transverse Cesarean Section  SURGEON:  Dr. Loma Boston   INDICATIONS: Olivia Brooks is a 34 y.o. G1P1001 at [redacted]w[redacted]d scheduled for cesarean section secondary to non-reassuring fetal status.  The risks of cesarean section discussed with the patient included but were not limited to: bleeding which may require transfusion or reoperation; infection which may require antibiotics; injury to bowel, bladder, ureters or other surrounding organs; injury to the fetus; need for additional procedures including hysterectomy in the event of a life-threatening hemorrhage; placental abnormalities wth subsequent pregnancies, incisional problems, thromboembolic phenomenon and other postoperative/anesthesia complications. The patient concurred with the proposed plan, giving informed written consent for the procedure.    FINDINGS:  Viable Female infant in vertex presentation.  Apgars 8 and 9, weight pending.  Clear amniotic fluid.  Intact placenta, three vessel cord.  Normal uterus, fallopian tubes and ovaries bilaterally.  ANESTHESIA:    Spinal INTRAVENOUS FLUIDS:2000 ml ESTIMATED BLOOD LOSS: 50 ml URINE OUTPUT:  500 ml SPECIMENS: Placenta sent to pathology COMPLICATIONS: None immediate  PROCEDURE IN DETAIL:  The patient received intravenous antibiotics and had sequential compression devices applied to her lower extremities while in the preoperative area.  She was then taken to the operating room where spinal anesthesia was administered and was found to be adequate. After the spinal was placed, heart tones were noted to be in the 80s.  She was then placed in a dorsal supine position with a leftward tilt, and prepped with betadyne and draped in a sterile manner.  A foley catheter was placed into her  bladder and attached to constant gravity, which drained clear fluid throughout.  After an adequate timeout was performed, a Pfannenstiel skin incision was made with scalpel and carried through to the underlying layer of fascia. The fascia was incised in the midline and this incision was extended bilaterally using the Mayo scissors. Kocher clamps were applied to the superior aspect of the fascial incision and the underlying rectus muscles were dissected off bluntly. A similar process was carried out on the inferior aspect of the facial incision. The rectus muscles were separated in the midline bluntly and the peritoneum was entered bluntly. An Alexis retractor was placed to aid in visualization of the uterus.  Attention was turned to the lower uterine segment where a transverse hysterotomy was made with a scalpel and extended bilaterally bluntly. The head was brought to the hysterotomy, but could not be delivered and a Kiwi Vacuum extractor applied, pressure was increased to green level, and the head was delivered with one pull.  The vacuum extractor was discontinued and the remainder of the infant was successfully delivered, the cord was clamped and cut, and infant was handed over to awaiting neonatology team.  A cord gas was obtained. Uterine massage was then administered and the placenta delivered intact with three-vessel cord. The uterus was then cleared of clot and debris.  The hysterotomy was closed with 0 Vicryl in a running locked fashion, and an imbricating layer was also placed with a 0 Vicryl. Overall, excellent hemostasis was noted. The abdomen and the pelvis were cleared of all clot and debris and the Ubaldo Glassing was removed. Hemostasis was confirmed on all surfaces.  The peritoneum was reapproximated using 2-0 vicryl running stitches. The fascia was then closed using 0 Vicryl in a running fashion.  The subcutaneous layer was  reapproximated with plain gut and the skin was closed with 4-0 vicryl. The patient  tolerated the procedure well. Sponge, lap, instrument and needle counts were not correct. When asking for the keith needle to close the skin, it was noted that the needle was not in the package.  Xray was done with patient on the operating table, which I reviewed myself, showing no needle visible in the pelvis. She was taken to the recovery room in stable condition.    Truett Mainland, DO 07/28/2015 1:09 PM

## 2015-07-28 NOTE — Progress Notes (Signed)
   07/28/15 0231  Vital Signs  BP (!) 182/91 mmHg  Pulse Rate 71  MD to be notified of BP.

## 2015-07-28 NOTE — Progress Notes (Signed)
FHR surveillance reviewed remotely. Primary RN unable to attend at bedside. Spontaneous return to baseline noted absent of interventions.

## 2015-07-28 NOTE — Transfer of Care (Signed)
Immediate Anesthesia Transfer of Care Note  Patient: Olivia Brooks  Procedure(s) Performed: Procedure(s): CESAREAN SECTION (N/A)  Patient Location: PACU  Anesthesia Type:Spinal  Level of Consciousness: awake, alert , oriented and patient cooperative  Airway & Oxygen Therapy: Patient Spontanous Breathing  Post-op Assessment: Report given to RN and Post -op Vital signs reviewed and stable  Post vital signs: Reviewed and stable  Last Vitals:  Filed Vitals:   07/28/15 1002 07/28/15 1056  BP: 130/73 153/81  Pulse: 58 78  Temp:    Resp:  20    Complications: No apparent anesthesia complications

## 2015-07-28 NOTE — Progress Notes (Signed)
Care relinquished and report given to [facility].

## 2015-07-28 NOTE — Anesthesia Postprocedure Evaluation (Signed)
Anesthesia Post Note  Patient: Olivia Brooks  Procedure(s) Performed: Procedure(s) (LRB): CESAREAN SECTION (N/A)  Patient location during evaluation: PACU Anesthesia Type: Spinal Level of consciousness: oriented and awake and alert Pain management: pain level controlled Vital Signs Assessment: post-procedure vital signs reviewed and stable Respiratory status: spontaneous breathing, respiratory function stable and patient connected to nasal cannula oxygen Cardiovascular status: blood pressure returned to baseline and stable Postop Assessment: No backache and No signs of nausea or vomiting Anesthetic complications: no    Last Vitals:  Filed Vitals:   07/28/15 1419 07/28/15 1430  BP: 145/89 159/81  Pulse: 68 58  Temp:    Resp: 12 21    Last Pain:  Filed Vitals:   07/28/15 1437  PainSc: 0-No pain                 [name] A.

## 2015-07-28 NOTE — Anesthesia Procedure Notes (Signed)
Spinal Patient location during procedure: OR Start time: 07/28/2015 11:57 AM Staffing Anesthesiologist: Josephine Igo Performed by: anesthesiologist  Preanesthetic Checklist Completed: patient identified, site marked, surgical consent, pre-op evaluation, timeout performed, IV checked, risks and benefits discussed and monitors and equipment checked Spinal Block Patient position: sitting Prep: site prepped and draped and DuraPrep Patient monitoring: heart rate, cardiac monitor, continuous pulse ox and blood pressure Approach: midline Location: L3-4 Injection technique: single-shot Needle Needle type: Sprotte, Tuohy and Spinocan  Needle gauge: 24 G Needle length: 9 cm Needle insertion depth: 8 cm Assessment Sensory level: T4 Additional Notes Patient tolerated procedure well. Adequate sensory level. Attempt x 2 with spinal needle. Difficult due to MO. Epidural space ID'd LOR with air. SAB performed through the Epidural needle. CSF clear with free flow, Transient paresthesia right leg. LA + narcotics injected. Needles withdrawn and patient placed supine with LUD.

## 2015-07-28 NOTE — H&P (Signed)
Olivia Brooks is a 34 y.o. female G1P0 with IUP at [redacted]w[redacted]d presenting for IOL 2/2 Chronic Hypertension. Pt states she has been having none contractions, associated with none vaginal bleeding.  Membranes are intact, with active fetal movement.   PNCare at  FT since 9 wks. Pt. Has been on Labetalol throughout her pregnancy for chronic hypertension.   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   Past Medical History: Past Medical History  Diagnosis Date  . Medical history non-contributory   . Hypertension   . Trichomonal vaginitis in pregnancy in second trimester 02/17/2015  . HSV-2 seropositive     Past Surgical History: Past Surgical History  Procedure Laterality Date  . No past surgeries      Obstetrical History: OB History    Gravida Para Term Preterm AB TAB SAB Ectopic Multiple Living   1               Social History: Social History   Social History  . Marital Status: Single    Spouse Name: N/A  . Number of Children: N/A  . Years of Education: N/A   Social History Main Topics  . Smoking status: Former Smoker    Types: Cigarettes  . Smokeless tobacco: Never Used     Comment: 1-2 cig daily  . Alcohol Use: No     Comment: not now  . Drug Use: No  . Sexual Activity: Not Currently    Birth Control/ Protection: None   Other Topics Concern  . Not on file   Social History Narrative    Family History: Family History  Problem Relation Age of Onset  . Cancer Mother   . Hyperlipidemia Brother     Allergies: No Known Allergies  Prescriptions prior to admission  Medication Sig Dispense Refill Last Dose  . acyclovir (ZOVIRAX) 400 MG tablet Take 1 tablet (400 mg total) by mouth 5 (five) times daily. 90 tablet 1 Taking  . aspirin 81 MG tablet Take 81 mg by mouth daily.    Taking  . labetalol (NORMODYNE) 200 MG tablet Take 1 tablet (200 mg total) by mouth 2 (two) times daily. 180 tablet 3 Taking  . omeprazole (PRILOSEC) 20 MG capsule Take 20 mg by mouth as needed.    Taking  . prenatal vitamin w/FE, FA (NATACHEW) 29-1 MG CHEW chewable tablet Chew 1 tablet by mouth daily at 12 noon. 30 tablet 11 Taking     Review of Systems   Constitutional: Negative.   Last menstrual period 11/02/2014. General appearance: alert, cooperative and no distress Lungs: clear to auscultation bilaterally Heart: regular rate and rhythm Abdomen: soft, non-tender; bowel sounds normal Pelvic: 1cm / 30% / -3 Extremities: Homans sign is negative, no sign of DVT Presentation: cephalic Fetal monitoringBaseline: 135 bpm, Variability: Good {> 6 bpm) and Accelerations: Reactive Uterine activityNone     Prenatal labs: ABO, Rh: A/Positive/-- (05/04 0955) Antibody: Negative (09/02 0911) Rubella: !Error! RPR: Non Reactive (09/02 0911)  HBsAg: Negative (05/04 0955)  HIV: Non Reactive (09/02 0911)  GBS: Negative (11/11 0000)  2 hr Glucola 87  Genetic screening  NT/IT negative.  Anatomy US EICF, otherwise normal.    Prenatal Transfer Tool  Maternal Diabetes: No Genetic Screening: Normal Maternal Ultrasounds/Referrals: Abnormal:  Findings:   Isolated EIF (echogenic intracardiac focus) Fetal Ultrasounds or  other Referrals:  None Maternal Substance Abuse:  No Significant Maternal Medications:  Meds include: Other: Labetalol Significant Maternal Lab Results: Lab values include: Group B Strep negative     No results found for this or any previous visit (from the past 24 hour(s)).  Assessment: Olivia Brooks is a 34 y.o. G1P0 at [redacted]w[redacted]d by 26w u/s here for IOL 2/2 CHTN. Pregnancy complicated by HSV, Trichomonas, infant with IEICF #Labor: Will start induction with cytotec q4hrs. Pitocin as needed.  # CHTN: Severe range bp's here without symptoms. Will get [place] labs.  -  Labetalol po 200mg  BID - Labetalol 10mg  IV prn elevated BP's.  #Pain: Fentanyl prn, May have epidural as requested.  #FWB: Cat I #ID:  GBS neg.  #MOF: Breast #MOC: OCP's #Circ:  Inpatient.   Caleb Melancon 07/28/2015, 1:44 AM

## 2015-07-29 ENCOUNTER — Encounter (HOSPITAL_COMMUNITY): Payer: Self-pay | Admitting: Family Medicine

## 2015-07-29 LAB — CBC
HCT: 29 % — ABNORMAL LOW (ref 36.0–46.0)
Hemoglobin: 9.3 g/dL — ABNORMAL LOW (ref 12.0–15.0)
MCH: 26.3 pg (ref 26.0–34.0)
MCHC: 32.1 g/dL (ref 30.0–36.0)
MCV: 81.9 fL (ref 78.0–100.0)
PLATELETS: 170 10*3/uL (ref 150–400)
RBC: 3.54 MIL/uL — AB (ref 3.87–5.11)
RDW: 16.3 % — ABNORMAL HIGH (ref 11.5–15.5)
WBC: 9.5 10*3/uL (ref 4.0–10.5)

## 2015-07-29 NOTE — Lactation Note (Signed)
This note was copied from the chart of Olivia Brooks. Lactation Consultation Note  Patient Name: Olivia Brooks [id number] Date: 07/29/2015 Reason for consult: Follow-up assessment  Baby 29 hours old and mom confirms that baby has not had a void. Mom was set up with a DEBP this morning at 1100, but has only pumped once during initial set up with patient's RN, Dorian Pod. Offered to assist mom with latching baby, but mom declined. Mom states that she thinks baby is latching fine. Mom states that baby suckles off-and-on, and is nursing 10-15 minutes with some swallows. Mom also states that she is seeing colostrum, but baby is also trying to "eat his fist" after being at breast. Enc mom to pump with DEBP, and gave hand pump with review as well. Discussed with mom that if she is not able to provide EBM, baby will need to be supplemented with formula. Mom trying to nap while this LC attempting to assist. Retrieved pump parts from off mom's floor and brought mom a wash tub and dish washing liquid.   Discussed assessment, interventions, and recommendations with patient's RN, Dorian Pod.  Maternal Data    Feeding Feeding Type: Breast Fed Length of feed: 15 min  LATCH Score/Interventions                      Lactation Tools Discussed/Used     Consult Status Consult Status: Follow-up Date: 07/30/15 Follow-up type: In-patient    Olivia Brooks 07/29/2015, 5:43 PM

## 2015-07-29 NOTE — Lactation Note (Signed)
This note was copied from the chart of Boy Beanca Sinyard. Lactation Consultation Note  Patient Name: Boy Itzayana Piller [id number] Date: 07/29/2015 Reason for consult: Follow-up assessment Baby at 32 hr of life an recorded wet diaper, baby has had 5 BM. Mom states that baby is latching well and she feels like he is getting breast milk. The RN offered formula from a slow flow bottle nipple and mom stated that baby hated it so she will pump if he needs another supplement. Discussed baby behavior, voids, wt loss, feeding frequency, manual expression, breast changes, and nipple care. Mom is aware of OP services and support group.   Maternal Data    Feeding Feeding Type: Breast Fed Length of feed: 45 min (total for both breast)  LATCH Score/Interventions Latch: Grasps breast easily, tongue down, lips flanged, rhythmical sucking.  Audible Swallowing: A few with stimulation Intervention(s): Skin to skin  Type of Nipple: Everted at rest and after stimulation  Comfort (Breast/Nipple): Soft / non-tender     Hold (Positioning): No assistance needed to correctly position infant at breast.  LATCH Score: 9  Lactation Tools Discussed/Used     Consult Status Consult Status: Follow-up Date: 07/30/15 Follow-up type: In-patient    Denzil Hughes 07/29/2015, 10:28 PM

## 2015-07-29 NOTE — Progress Notes (Addendum)
POSTPARTUM PROGRESS NOTE  Post Partum Day 1 Subjective:  Olivia Brooks is a 34 y.o. G1P1001 [redacted]w[redacted]d s/p pltcs for nrfht.  No acute events overnight.  Pt denies problems with ambulating, voiding or po intake.  She denies nausea or vomiting.  Pain is well controlled.  She has had flatus. She has not had bowel movement.  Lochia Small. No ha, vision change, ruq pain, or shortness of breath  Objective: Blood pressure 93/65, pulse 71, temperature 98.9 F (37.2 C), temperature source Oral, resp. rate 20, height 5\' 9"  (1.753 m), weight 330 lb (149.687 kg), last menstrual period 11/02/2014, SpO2 97 %, unknown if currently breastfeeding.  Physical Exam:  General: alert, cooperative and no distress Lochia:normal flow Chest: CTAB Heart: RRR no m/r/g Abdomen: +BS, soft, nontender, incision c/d/i Uterine Fundus: firm,  DVT Evaluation: No calf swelling or tenderness Extremities: trace edema   Recent Labs  07/28/15 0145  HGB 9.7*  HCT 29.9*    Assessment/Plan:  ASSESSMENT: Olivia Brooks is a 34 y.o. G1P1001 [redacted]w[redacted]d s/p pltcs. Doing well. Post-op H/H pending. For moderate to severely elevated BPs PP gave labetalol 200 mg po once and started amlodipine 10. BP this morning is wnl.  Plan for discharge tomorrow   LOS: 1 day   Desma Maxim 07/29/2015, 6:41 AM

## 2015-07-30 DIAGNOSIS — Z98891 History of uterine scar from previous surgery: Secondary | ICD-10-CM

## 2015-07-30 MED ORDER — OXYCODONE-ACETAMINOPHEN 5-325 MG PO TABS: 1.0000 | ORAL_TABLET | ORAL | Status: DC | PRN

## 2015-07-30 MED ORDER — IBUPROFEN 600 MG PO TABS: 600.0000 mg | ORAL_TABLET | Freq: Four times a day (QID) | ORAL | Status: DC

## 2015-07-30 MED ORDER — AMLODIPINE BESYLATE 10 MG PO TABS: 10.0000 mg | ORAL_TABLET | Freq: Every day | ORAL | Status: DC

## 2015-07-30 NOTE — Lactation Note (Signed)
This note was copied from the chart of Olivia Shai Hoeschen. Lactation Consultation Note Mom has large everted sore nipples. Tubular breast w/approx. 2 fingers wide space between breast. Denies PCOS.  Hand expressed easy flow of colostrum. Mom states when she pumps nothing comes out. Explained hand expression can get more out. D/t SGA mom needs to supplement after BF. If notices milk coming in, can supplement w/BM if quanity adequate. Discussed engorgement, BF, breast massage, I&O, supply and demand.  Reviewed positioning and obtaining deep latches. Mom eager to go home, states baby needs to be circumcised before d/c home.  Patient Name: Olivia Brooks [id number] Date: 07/30/2015 Reason for consult: Follow-up assessment   Maternal Data    Feeding Feeding Type: Breast Fed Nipple Type: Slow - flow Length of feed: 10 min (still BF off and on)  LATCH Score/Interventions Latch: Grasps breast easily, tongue down, lips flanged, rhythmical sucking. Intervention(s): Adjust position;Assist with latch;Breast massage;Breast compression  Audible Swallowing: A few with stimulation Intervention(s): Hand expression Intervention(s): Hand expression  Type of Nipple: Everted at rest and after stimulation  Comfort (Breast/Nipple): Filling, red/small blisters or bruises, mild/mod discomfort  Problem noted: Mild/Moderate discomfort Interventions (Mild/moderate discomfort): Comfort gels;Post-pump  Hold (Positioning): Assistance needed to correctly position infant at breast and maintain latch. Intervention(s): Support Pillows;Position options  LATCH Score: 7  Lactation Tools Discussed/Used Tools: Pump;Comfort gels Breast pump type: Double-Electric Breast Pump   Consult Status Consult Status: Complete Date: 07/30/15 Follow-up type: In-patient    Ladeidra Borys, Elta Guadel[place] 07/30/2015, 6:30 AM

## 2015-07-30 NOTE — Discharge Summary (Signed)
OB Discharge Summary     Patient Name: Olivia Brooks DOB: Aug 09, 1981 MRN: [id number]  Date of admission: 07/28/2015 Delivering MD: Truett Mainland   Date of discharge: 07/30/2015  Admitting diagnosis: INDUCTION Intrauterine pregnancy: [redacted]w[redacted]d     Secondary diagnosis:  Principal Problem:   S/P primary low transverse C-section Active Problems:   Supervision of other high-risk pregnancy   Uterine fibroid during pregnancy, antepartum   Obesity   Chronic hypertension during pregnancy, antepartum   Condylomata lata of vulva   High-risk pregnancy   Herpes simplex virus type 2 (HSV-2) infection affecting pregnancy in third trimester, antepartum   Chronic hypertension complicating or reason for care during pregnancy  Additional problems: NRFHT in Labor leading to recommendation of cesarean delivery.      Discharge diagnosis: Term Pregnancy Delivered and CHTN                                                                                                Post partum procedures:None  Augmentation: Cytotec and Foley Balloon  Complications: None  Hospital course:  Induction of Labor With Cesarean Section  34 y.o. yo G1P1001 at [redacted]w[redacted]d was admitted to the hospital 07/28/2015 for induction of labor. Patient had a labor course significant for NRFHT and fetal intolerance of labor. The patient went for cesarean section due to Non-Reassuring FHR, and delivered a Viable infant. Membrane Rupture Time/Date: 12:07 PM ,07/28/2015  Details of operation can be found in separate operative Note.  Patient had an uncomplicated postpartum course. She is ambulating, tolerating a regular diet, passing flatus, and urinating well.  Patient is discharged home in stable condition on 07/30/2015                                  Physical exam  Filed Vitals:   07/29/15 1000 07/29/15 1110 07/29/15 1731 07/30/15 0500  BP: 139/57 139/57 134/52 141/77  Pulse:  66 62 63  Temp:  98.8 F (37.1 C) 98.6 F (37 C) 99.2  F (37.3 C)  TempSrc:  Oral Oral Oral  Resp:  18 18 20   Height:      Weight:      SpO2:  98%     General: alert, cooperative and no distress Lochia: appropriate Uterine Fundus: firm Incision: Healing well with no significant drainage, No significant erythema, Dressing is clean, dry, and intact DVT Evaluation: No evidence of DVT seen on physical exam. Labs: Lab Results  Component Value Date   WBC 9.5 07/29/2015   HGB 9.3* 07/29/2015   HCT 29.0* 07/29/2015   MCV 81.9 07/29/2015   PLT 170 07/29/2015   CMP Latest Ref Rng 07/28/2015  Glucose 65 - 99 mg/dL 81  BUN 6 - 20 mg/dL 11  Creatinine 0.44 - 1.00 mg/dL 0.85  Sodium 135 - 145 mmol/L 135  Potassium 3.5 - 5.1 mmol/L 4.3  Chloride 101 - 111 mmol/L 106  CO2 22 - 32 mmol/L 18(L)  Calcium 8.9 - 10.3 mg/dL 9.2  Total Protein 6.5 - 8.1 g/dL 6.7  Total Bilirubin 0.3 - 1.2 mg/dL 0.4  Alkaline Phos 38 - 126 U/L 112  AST 15 - 41 U/L 26  ALT 14 - 54 U/L 17    Discharge instruction: per After Visit Summary and "Baby and Me Booklet".  After visit meds:    Medication List    TAKE these medications        acyclovir 400 MG tablet  Commonly known as:  ZOVIRAX  Take 1 tablet (400 mg total) by mouth 5 (five) times daily.     amLODipine 10 MG tablet  Commonly known as:  NORVASC  Take 1 tablet (10 mg total) by mouth daily.     aspirin 81 MG tablet  Take 81 mg by mouth daily.     labetalol 200 MG tablet  Commonly known as:  NORMODYNE  Take 1 tablet (200 mg total) by mouth 2 (two) times daily.     oxyCODONE-acetaminophen 5-325 MG tablet  Commonly known as:  PERCOCET/ROXICET  Take 1 tablet by mouth every 4 (four) hours as needed (for pain scale 4-7).     prenatal vitamin w/FE, FA 29-1 MG Chew chewable tablet  Chew 1 tablet by mouth daily at 12 noon.        Diet: carb modified diet  Activity: Advance as tolerated. Pelvic rest for 6 weeks.   Outpatient follow up:2 weeks Follow up Appt: Future Appointments Date Time  Provider [place]  08/07/2015 1:30 PM Florian Buff, MD [facility]   Follow up Visit:No Follow-up on file.  Postpartum contraception: Undecided Wants to talk with Dr. Elonda Husky.  Chronic HTN - Home with amlodipine daily.   Newborn Data: Live born female  Birth Weight: 6 lb 2.1 oz (2780 g) APGAR: 8, 9  Baby Feeding: Breast Disposition:home with mother   07/30/2015 Caren Macadam, MD   OB fellow attestation I have seen and examined this patient and agree with above documentation in the resident's note.   Olivia Brooks is a 34 y.o. G1P1001 s/p pLTCS for NRFHT.   Pain is well controlled.  Plan for birth control is oral contraceptives (estrogen/progesterone) but wants to discuss with Dr. Elonda Husky.  Method of Feeding: breast  PE:  BP 141/77 mmHg  Pulse 63  Temp(Src) 99.2 F (37.3 C) (Oral)  Resp 20  Ht 5\' 9"  (1.753 m)  Wt 330 lb (149.687 kg)  BMI 48.71 kg/m2  SpO2 98%  LMP 11/02/2014  Breastfeeding? Unknown Gen: well appearing Heart: reg rate Lungs: normal WOB Fundus firm Ext: soft, no pain, no edema   Recent Labs  07/28/15 0145 07/29/15 0515  HGB 9.7* 9.3*  HCT 29.9* 29.0*   Plan: discharge today - postpartum care discussed - f/u clinic in 6 weeks for postpartum visit  Caren Macadam, MD 7:55 AM

## 2015-07-30 NOTE — Discharge Instructions (Signed)
Cesarean Delivery, Care After  Refer to this sheet in the next few weeks. These instructions provide you with information on caring for yourself after your procedure. Your health care provider may also give you specific instructions. Your treatment has been planned according to current medical practices, but problems sometimes occur. Call your health care provider if you have any problems or questions after you go home.  HOME CARE INSTRUCTIONS   Only take over-the-counter or prescription medications as directed by your health care provider.   Do not drink alcohol, especially if you are breastfeeding or taking medication to relieve pain.   Do not chew or smoke tobacco.   Continue to use good perineal care. Good perineal care includes:    Wiping your perineum from front to back.    Keeping your perineum clean.   Check your surgical cut (incision) daily for increased redness, drainage, swelling, or separation of skin.   Clean your incision gently with soap and water every day, and then pat it dry. If your health care provider says it is okay, leave the incision uncovered. Use a bandage (dressing) if the incision is draining fluid or appears irritated. If the adhesive strips across the incision do not fall off within 7 days, carefully peel them off.   Hug a pillow when coughing or sneezing until your incision is healed. This helps to relieve pain.   Do not use tampons or douche until your health care provider says it is okay.   Shower, wash your hair, and take tub baths as directed by your health care provider.   Wear a well-fitting bra that provides breast support.   Limit wearing support panties or control-top hose.   Drink enough fluids to keep your urine clear or pale yellow.   Eat high-fiber foods such as whole grain cereals and breads, brown rice, beans, and fresh fruits and vegetables every day. These foods may help prevent or relieve constipation.   Resume activities such as climbing stairs,  driving, lifting, exercising, or traveling as directed by your health care provider.   Talk to your health care provider about resuming sexual activities. This is dependent upon your risk of infection, your rate of healing, and your comfort and desire to resume sexual activity.   Try to have someone help you with your household activities and your newborn for at least a few days after you leave the hospital.   Rest as much as possible. Try to rest or take a nap when your newborn is sleeping.   Increase your activities gradually.   Keep all of your scheduled postpartum appointments. It is very important to keep your scheduled follow-up appointments. At these appointments, your health care provider will be checking to make sure that you are healing physically and emotionally.  SEEK MEDICAL CARE IF:    You are passing large clots from your vagina. Save any clots to show your health care provider.   You have a foul smelling discharge from your vagina.   You have trouble urinating.   You are urinating frequently.   You have pain when you urinate.   You have a change in your bowel movements.   You have increasing redness, pain, or swelling near your incision.   You have pus draining from your incision.   Your incision is separating.   You have painful, hard, or reddened breasts.   You have a severe headache.   You have blurred vision or see spots.   You feel sad   or depressed.   You have thoughts of hurting yourself or your newborn.   You have questions about your care, the care of your newborn, or medications.   You are dizzy or light-headed.   You have a rash.   You have pain, redness, or swelling at the site of the removed intravenous access (IV) tube.   You have nausea or vomiting.   You stopped breastfeeding and have not had a menstrual period within 12 weeks of stopping.   You are not breastfeeding and have not had a menstrual period within 12 weeks of delivery.   You have a fever.  SEEK  IMMEDIATE MEDICAL CARE IF:   You have persistent pain.   You have chest pain.   You have shortness of breath.   You faint.   You have leg pain.   You have stomach pain.   Your vaginal bleeding saturates 2 or more sanitary pads in 1 hour.  MAKE SURE YOU:    Understand these instructions.   Will watch your condition.   Will get help right away if you are not doing well or get worse.     This information is not intended to replace advice given to you by your health care provider. Make sure you discuss any questions you have with your health care provider.     Document Released: 05/08/2002 Document Revised: 09/06/2014 Document Reviewed: 04/12/2012  Elsevier Interactive Patient Education 2016 [employer]

## 2015-08-04 ENCOUNTER — Telehealth: Payer: Self-pay | Admitting: Obstetrics & Gynecology

## 2015-08-04 ENCOUNTER — Encounter: Payer: BLUE CROSS/BLUE SHIELD | Admitting: Obstetrics & Gynecology

## 2015-08-04 NOTE — Telephone Encounter (Signed)
Pt had c-section on 07/28/2015. Pt states took the infant to the pediatrics and was told to take the baby to ER as soon as possible. She is now at [facility] ER, baby sugar dropped and seizures.   Pt states she is being informed by the MD at the ER that the Labetalol the pt took during pregnancy could have caused the infants blood sugars to drop and seizures. Pt states is this possible? Please advise.

## 2015-08-04 NOTE — Telephone Encounter (Signed)
I called and left pt message on phone  Gist of message is labetalol can cause neonatal hypoglycemia but only in the first 24-48 hours of life not thereafter So 1 week down the road I doubt the labetalol is contributing to any hypoglycemia I told Barbar to cll back if needed

## 2015-08-07 ENCOUNTER — Encounter: Payer: BLUE CROSS/BLUE SHIELD | Admitting: Obstetrics & Gynecology

## 2015-08-19 ENCOUNTER — Encounter: Payer: Self-pay | Admitting: Obstetrics & Gynecology

## 2015-08-19 ENCOUNTER — Ambulatory Visit (INDEPENDENT_AMBULATORY_CARE_PROVIDER_SITE_OTHER): Payer: BLUE CROSS/BLUE SHIELD | Admitting: Obstetrics & Gynecology

## 2015-08-19 VITALS — BP 158/90 | HR 78 | Wt 325.0 lb

## 2015-08-19 DIAGNOSIS — Z09 Encounter for follow-up examination after completed treatment for conditions other than malignant neoplasm: Secondary | ICD-10-CM | POA: Diagnosis not present

## 2015-08-19 DIAGNOSIS — Z98891 History of uterine scar from previous surgery: Secondary | ICD-10-CM

## 2015-08-19 MED ORDER — KETOROLAC TROMETHAMINE 10 MG PO TABS: 10.0000 mg | ORAL_TABLET | Freq: Three times a day (TID) | ORAL | Status: DC | PRN

## 2015-08-19 NOTE — Progress Notes (Signed)
Patient ID: Olivia Brooks, female   DOB: 1981/05/04, 34 y.o.   MRN: [id number]  HPI: Patient returns for routine postoperative follow-up having undergone primary Caesarean section on 11/28.  The patient's immediate postoperative recovery has been unremarkable. Since hospital discharge the patient reports no problems.   Current Outpatient Prescriptions: amLODipine (NORVASC) 10 MG tablet, Take 1 tablet (10 mg total) by mouth daily., Disp: 30 tablet, Rfl: 0 ibuprofen (ADVIL,MOTRIN) 600 MG tablet, Take 1 tablet (600 mg total) by mouth every 6 (six) hours., Disp: 90 tablet, Rfl: 1 prenatal vitamin w/FE, FA (NATACHEW) 29-1 MG CHEW chewable tablet, Chew 1 tablet by mouth daily at 12 noon., Disp: 30 tablet, Rfl: 11  No current facility-administered medications for this visit.    Blood pressure 158/90, pulse 78, weight 325 lb (147.419 kg), unknown if currently breastfeeding.  Physical Exam: Incision clean dry intact  Diagnostic Tests:   Pathology:   Impression: S/p primary Caesareans section  Plan:   Follow up: 3  weeks  Florian Buff, MD

## 2015-08-20 ENCOUNTER — Telehealth: Payer: Self-pay | Admitting: *Deleted

## 2015-08-20 NOTE — Telephone Encounter (Signed)
Santiago Glad with the [facility] Department stated pt blood pressure in right arm 164/94, left arm 144/90. Pt told Santiago Glad she had not taken her Norvasc today usually takes in the evening. Please advise.

## 2015-08-21 NOTE — Telephone Encounter (Signed)
Noted, no new recommendations.

## 2015-09-04 ENCOUNTER — Ambulatory Visit (INDEPENDENT_AMBULATORY_CARE_PROVIDER_SITE_OTHER): Payer: BLUE CROSS/BLUE SHIELD | Admitting: Advanced Practice Midwife

## 2015-09-04 ENCOUNTER — Encounter: Payer: Self-pay | Admitting: Advanced Practice Midwife

## 2015-09-04 VITALS — BP 144/90 | HR 82 | Ht 69.0 in | Wt 319.0 lb

## 2015-09-04 DIAGNOSIS — Z3202 Encounter for pregnancy test, result negative: Secondary | ICD-10-CM | POA: Diagnosis not present

## 2015-09-04 LAB — POCT URINE PREGNANCY: PREG TEST UR: NEGATIVE

## 2015-09-04 MED ORDER — TRIAMTERENE-HCTZ 75-50 MG PO TABS: 1.0000 | ORAL_TABLET | Freq: Every day | ORAL | Status: DC

## 2015-09-04 MED ORDER — NORETHINDRONE 0.35 MG PO TABS: 1.0000 | ORAL_TABLET | Freq: Every day | ORAL | Status: DC

## 2015-09-04 NOTE — Progress Notes (Signed)
KARRISSA STREITZ is a 35 y.o. who presents for a postpartum visit. She is 5 weeks postpartum following a low cervical transverse Cesarean section. I have fully reviewed the prenatal and intrapartum course. The delivery was at 63 gestational weeks.  Anesthesia: epidural. Postpartum course has been uneventful. Baby's course was complicated by hypoglycemic seizures. Baby is feeding by bottle (had breastfed in the beginning). Bleeding: thinks she started period a few days ago. Bowel function is normal. Bladder function is normal. Patient is not sexually active. Contraception method is none. Postpartum depression screening: negative. Norvasc makes her too sleepy.  Wants to change meds.  Did not take meds before pregnancy.    Current outpatient prescriptions:  .  amLODipine (NORVASC) 10 MG tablet, Take 1 tablet (10 mg total) by mouth daily., Disp: 30 tablet, Rfl: 0 .  ibuprofen (ADVIL,MOTRIN) 600 MG tablet, Take 1 tablet (600 mg total) by mouth every 6 (six) hours., Disp: 90 tablet, Rfl: 1 .  prenatal vitamin w/FE, FA (NATACHEW) 29-1 MG CHEW chewable tablet, Chew 1 tablet by mouth daily at 12 noon., Disp: 30 tablet, Rfl: 11 .  ketorolac (TORADOL) 10 MG tablet, Take 1 tablet (10 mg total) by mouth every 8 (eight) hours as needed. (Patient not taking: Reported on 09/04/2015), Disp: 15 tablet, Rfl: 0  Review of Systems   Constitutional: Negative for fever and chills Eyes: Negative for visual disturbances Respiratory: Negative for shortness of breath, dyspnea Cardiovascular: Negative for chest pain or palpitations  Gastrointestinal: Negative for vomiting, diarrhea and constipation Genitourinary: Negative for dysuria and urgency Musculoskeletal: Negative for back pain, joint pain, myalgias  Neurological: Negative for dizziness and headaches   Objective:     Filed Vitals:   09/04/15 1450  BP: 144/90  Pulse: 82   General:  alert, cooperative and no distress   Breasts:  negative  Lungs: clear to  auscultation bilaterally  Heart:  regular rate and rhythm  Abdomen: Soft, nontender.  A little bit of moisture changes on right side painted with gentian violet   Vulva:  normal  Vagina: normal vagina  Cervix:  closed  Corpus: Well involuted     Rectal Exam: no hemorrhoids        Assessment:    normal postpartum exam.  Plan:    1. Contraception: oral progesterone-only contraceptive  Rx Maxide 2. Follow up in: 1 week for BP check or as needed.

## 2015-09-11 ENCOUNTER — Ambulatory Visit: Payer: BLUE CROSS/BLUE SHIELD | Admitting: Advanced Practice Midwife

## 2015-09-11 ENCOUNTER — Encounter: Payer: Self-pay | Admitting: Obstetrics & Gynecology

## 2015-09-11 ENCOUNTER — Ambulatory Visit (INDEPENDENT_AMBULATORY_CARE_PROVIDER_SITE_OTHER): Payer: BLUE CROSS/BLUE SHIELD | Admitting: Obstetrics & Gynecology

## 2015-09-11 VITALS — BP 130/80 | HR 80 | Wt 307.0 lb

## 2015-09-11 DIAGNOSIS — R252 Cramp and spasm: Secondary | ICD-10-CM | POA: Diagnosis not present

## 2015-09-11 DIAGNOSIS — I10 Essential (primary) hypertension: Secondary | ICD-10-CM | POA: Diagnosis not present

## 2015-09-11 MED ORDER — TRICHLOROACETIC ACID 80 % EX LIQD: 1.0000 "application " | Freq: Once | CUTANEOUS | Status: DC

## 2015-09-11 MED ORDER — TRIAMTERENE-HCTZ 75-50 MG PO TABS: 1.0000 | ORAL_TABLET | Freq: Every day | ORAL | Status: DC

## 2015-09-11 NOTE — Progress Notes (Signed)
Patient ID: Olivia Brooks, female   DOB: 1981/08/13, 35 y.o.   MRN: [id number]      Chief Complaint  Patient presents with  . Blood Pressure Check    c/c stomach pain    Blood pressure 130/80, pulse 80, weight 307 lb (139.254 kg), last menstrual period 09/04/2015, not currently breastfeeding.  35 y.o. G1P1001 Patient's last menstrual period was 09/04/2015. The current method of family planning is oral progesterone-only contraceptive.  Subjective Pt with hand feet abdominal wall and scattered muscle cramps since starting her amlodipine Never had this happen previously  Objective Abdomen soft benign Incision clean dry intact Pelvic benign exam no pain or masses  Pertinent ROS No burning with urination, frequency or urgency No nausea, vomiting or diarrhea Nor fever chills or other constitutional symptoms   Labs or studies     Impression Diagnoses this Encounter::   ICD-9-CM ICD-10-CM   1. Cramps, muscle, general 729.82 R25.2   2. Hypertension, essential, benign 401.1 I10     Established relevant diagnosis(es):   Plan/Recommendations: Meds ordered this encounter  Medications  . triamterene-hydrochlorothiazide (MAXZIDE) 75-50 MG tablet    Sig: Take 1 tablet by mouth daily.    Dispense:  30 tablet    Refill:  3  . trichloroacetic acid 80 % LIQD    Sig: Apply 1 application topically once.    Dispense:  1 Bottle    Refill:  0    Discontinued amlodipine  Labs or Scans Ordered: No orders of the defined types were placed in this encounter.    Management::   Follow up 1  weeks     All questions were answered.

## 2015-09-18 ENCOUNTER — Ambulatory Visit (INDEPENDENT_AMBULATORY_CARE_PROVIDER_SITE_OTHER): Payer: BLUE CROSS/BLUE SHIELD | Admitting: Obstetrics & Gynecology

## 2015-09-18 ENCOUNTER — Encounter: Payer: Self-pay | Admitting: Obstetrics & Gynecology

## 2015-09-18 VITALS — BP 140/90 | HR 72 | Wt 313.0 lb

## 2015-09-18 DIAGNOSIS — N760 Acute vaginitis: Secondary | ICD-10-CM | POA: Diagnosis not present

## 2015-09-18 DIAGNOSIS — A499 Bacterial infection, unspecified: Secondary | ICD-10-CM | POA: Diagnosis not present

## 2015-09-18 DIAGNOSIS — B9689 Other specified bacterial agents as the cause of diseases classified elsewhere: Secondary | ICD-10-CM

## 2015-09-18 MED ORDER — METRONIDAZOLE 0.75 % VA GEL: VAGINAL | Status: DC

## 2015-09-18 MED ORDER — TRICHLOROACETIC ACID 80 % EX LIQD: 1.0000 "application " | Freq: Once | CUTANEOUS | Status: DC

## 2015-09-18 NOTE — Progress Notes (Signed)
Patient ID: Olivia Brooks, female   DOB: April 04, 1981, 35 y.o.   MRN: [id number]      Chief Complaint  Patient presents with  . gyn visit    vaginal odor, no discharge    Blood pressure 140/90, pulse 72, weight 313 lb (141.976 kg), last menstrual period 09/04/2015, not currently breastfeeding.  35 y.o. G1P1001 Patient's last menstrual period was 09/04/2015. The current method of family planning is none.  Subjective Discharge with odor  Objective Vulva:  normal appearing vulva with no masses, tenderness or lesions Vagina:  normal mucosa, thin grey discharge Cervix:  no cervical motion tenderness and no lesions Uterus:   Adnexa: ovaries:,      Pertinent ROS   Labs or studies     Impression Diagnoses this Encounter::   ICD-9-CM ICD-10-CM   1. BV (bacterial vaginosis) 616.10 N76.0    041.9 A49.9     Established relevant diagnosis(es):   Plan/Recommendations: Meds ordered this encounter  Medications  . metroNIDAZOLE (METROGEL VAGINAL) 0.75 % vaginal gel    Sig: Nightly x 5 nights    Dispense:  70 g    Refill:  0  . trichloroacetic acid 80 % LIQD    Sig: Apply 1 application topically once.    Dispense:  1 Bottle    Refill:  0    Labs or Scans Ordered: No orders of the defined types were placed in this encounter.    Management::   Follow up  No Follow-up on file.        Face to face time:  15 minutes  Greater than 50% of the visit time was spent in counseling and coordination of care with the patient.  The summary and outline of the counseling and care coordination is summarized in the note above.   All questions were answered.

## 2015-10-07 ENCOUNTER — Telehealth: Payer: Self-pay | Admitting: Obstetrics & Gynecology

## 2015-10-07 NOTE — Telephone Encounter (Signed)
Pt c/o sharp pain at belly button and at incision site. Ibuprofen not helping. Went to [facility] ER, but there was a lot of people sick, vomiting, pt left before being seen. Pt given an appt with Dr.Eure tomorrow.

## 2015-10-07 NOTE — Telephone Encounter (Signed)
Pt called asking to speak with Chrystal, Pt did not give the reason why. Please contact pt

## 2015-10-08 ENCOUNTER — Ambulatory Visit (INDEPENDENT_AMBULATORY_CARE_PROVIDER_SITE_OTHER): Payer: BLUE CROSS/BLUE SHIELD | Admitting: Obstetrics & Gynecology

## 2015-10-08 ENCOUNTER — Encounter: Payer: Self-pay | Admitting: Obstetrics & Gynecology

## 2015-10-08 VITALS — BP 110/80 | HR 72 | Wt 318.0 lb

## 2015-10-08 DIAGNOSIS — R109 Unspecified abdominal pain: Secondary | ICD-10-CM

## 2015-10-08 MED ORDER — PIROXICAM 20 MG PO CAPS: 20.0000 mg | ORAL_CAPSULE | Freq: Every day | ORAL | Status: DC

## 2015-10-08 MED ORDER — HYDROCODONE-ACETAMINOPHEN 5-325 MG PO TABS: 1.0000 | ORAL_TABLET | Freq: Four times a day (QID) | ORAL | Status: DC | PRN

## 2015-10-08 NOTE — Progress Notes (Signed)
Patient ID: Olivia Brooks, female   DOB: 12/10/1980, 35 y.o.   MRN: [id number]      Chief Complaint  Patient presents with  . sharpe pain    belly button    Blood pressure 110/80, pulse 72, weight 318 lb (144.244 kg), last menstrual period 09/29/2015, not currently breastfeeding.  35 y.o. G1P1001 Patient's last menstrual period was 09/29/2015. The current method of family planning is OCP (estrogen/progesterone).  Subjective Pt with increase in peri umbilical and upper abdominal wall pain  Also with pain down left leg  Objective Abdomen soft no guarding benign  Pertinent ROS No burning with urination, frequency or urgency No nausea, vomiting or diarrhea Nor fever chills or other constitutional symptoms   Labs or studies     Impression Diagnoses this Encounter::   ICD-9-CM ICD-10-CM   1. Abdominal wall pain 789.00 R10.9     Established relevant diagnosis(es):   Plan/Recommendations: Meds ordered this encounter  Medications  . DISCONTD: piroxicam (FELDENE) 20 MG capsule    Sig: Take 1 capsule (20 mg total) by mouth daily.    Dispense:  30 capsule    Refill:  1  . DISCONTD: HYDROcodone-acetaminophen (NORCO/VICODIN) 5-325 MG tablet    Sig: Take 1 tablet by mouth every 6 (six) hours as needed.    Dispense:  30 tablet    Refill:  0    Labs or Scans Ordered: No orders of the defined types were placed in this encounter.    Management::   Follow up  Return in about 1 week (around 10/15/2015) for Follow up, with Dr Elonda Husky.         All questions were answered.

## 2015-10-15 ENCOUNTER — Ambulatory Visit: Payer: BLUE CROSS/BLUE SHIELD | Admitting: Obstetrics & Gynecology

## 2015-10-24 ENCOUNTER — Ambulatory Visit (INDEPENDENT_AMBULATORY_CARE_PROVIDER_SITE_OTHER): Payer: BLUE CROSS/BLUE SHIELD | Admitting: Obstetrics & Gynecology

## 2015-10-24 ENCOUNTER — Encounter: Payer: Self-pay | Admitting: Obstetrics & Gynecology

## 2015-10-24 VITALS — BP 130/90 | HR 76 | Wt 322.0 lb

## 2015-10-24 DIAGNOSIS — A630 Anogenital (venereal) warts: Secondary | ICD-10-CM | POA: Diagnosis not present

## 2015-10-24 MED ORDER — DESOGESTREL-ETHINYL ESTRADIOL 0.15-0.02/0.01 MG (21/5) PO TABS: 1.0000 | ORAL_TABLET | Freq: Every day | ORAL | Status: DC

## 2015-10-24 NOTE — Progress Notes (Signed)
Patient ID: Olivia Brooks, female   DOB: [birth year], 35 y.o.   MRN: [id number]      Chief Complaint  Patient presents with  . want to change birth control    gaining weight/ TCA treatment    Blood pressure 130/90, pulse 76, weight 322 lb (146.058 kg), last menstrual period 09/29/2015, not currently breastfeeding.  35 y.o. G1P1001 Patient's last menstrual period was 09/29/2015. The current method of family planning is OCP (estrogen/progesterone).  Subjective condyloma  Objective 3 warts present:  Treated with TCA  Pertinent ROS   Labs or studies     Impression Diagnoses this Encounter::   ICD-9-CM ICD-10-CM   1. Condyloma acuminata 078.11 A63.0     Established relevant diagnosis(es):   Plan/Recommendations: No orders of the defined types were placed in this encounter.    Labs or Scans Ordered: Meds ordered this encounter  Medications  . desogestrel-ethinyl estradiol (KARIVA,AZURETTE,MIRCETTE) 0.15-0.02/0.01 MG (21/5) tablet    Sig: Take 1 tablet by mouth daily.    Dispense:  1 Package    Refill:  11     Management::   Follow up  Return if symptoms worsen or fail to improve.      All questions were answered.

## 2015-11-05 ENCOUNTER — Telehealth: Payer: Self-pay | Admitting: Women's Health

## 2015-11-05 NOTE — Telephone Encounter (Signed)
Pt states B/P today 140/105, state coworker had a strong perfume on and she thinks caused elevated B/P and headache. Pt states she is now at [facility] ER and B/P is 168/88 waiting to see the MD and will get a note for work from them.

## 2015-11-15 ENCOUNTER — Emergency Department (HOSPITAL_COMMUNITY)
Admission: EM | Admit: 2015-11-15 | Discharge: 2015-11-15 | Disposition: A | Discharge status: Home / Self Care | Payer: BLUE CROSS/BLUE SHIELD | Attending: Emergency Medicine | Admitting: Emergency Medicine

## 2015-11-15 ENCOUNTER — Encounter (HOSPITAL_COMMUNITY): Payer: Self-pay | Admitting: Emergency Medicine

## 2015-11-15 ENCOUNTER — Emergency Department (HOSPITAL_COMMUNITY): Payer: BLUE CROSS/BLUE SHIELD

## 2015-11-15 DIAGNOSIS — Z87891 Personal history of nicotine dependence: Secondary | ICD-10-CM | POA: Diagnosis not present

## 2015-11-15 DIAGNOSIS — I10 Essential (primary) hypertension: Secondary | ICD-10-CM | POA: Diagnosis not present

## 2015-11-15 DIAGNOSIS — Z79899 Other long term (current) drug therapy: Secondary | ICD-10-CM | POA: Insufficient documentation

## 2015-11-15 DIAGNOSIS — M79652 Pain in left thigh: Secondary | ICD-10-CM | POA: Diagnosis not present

## 2015-11-15 MED ORDER — IBUPROFEN 800 MG PO TABS: 800.0000 mg | ORAL_TABLET | Freq: Three times a day (TID) | ORAL | Status: DC

## 2015-11-15 MED ORDER — METHOCARBAMOL 500 MG PO TABS: 500.0000 mg | ORAL_TABLET | Freq: Four times a day (QID) | ORAL | Status: DC

## 2015-11-15 MED ORDER — IBUPROFEN 800 MG PO TABS
800.0000 mg | ORAL_TABLET | Freq: Once | ORAL | Status: AC
  Administered 2015-11-15: 800 mg via ORAL
  Filled 2015-11-15: qty 1

## 2015-11-15 NOTE — Discharge Instructions (Signed)

## 2015-11-15 NOTE — ED Notes (Addendum)
PT c/o upper left leg pain x3 months and HTN medication changed last week and a steroid IM injection with no relief. PT states increased pain with changing positing but has constant pain. PT denies any injury.

## 2015-11-15 NOTE — ED Provider Notes (Signed)
CSN: [id number]     Arrival date & time 11/15/15  1141 History  By signing my name below, I, Emmanuella Mensah, attest that this documentation has been prepared under the direction and in the presence of Noemi Chapel, MD. Electronically Signed: Judithann Sauger, ED Scribe. 11/15/2015. 12:11 PM.      Chief Complaint  Patient presents with  . Leg Pain   The history is provided by the patient. No language interpreter was used.   HPI Comments: Olivia Brooks is a 35 y.o. female with a hx of HTN and HSV who presents to the Emergency Department complaining of constant gradually worsening pain to the lateral aspect of her left thigh onset 3 months. She reports associated pain with ROM. She explains that her leg pain usually occurs during her menstrual cycle and stopped while she was pregnant but now the pain has returned after giving birth 3 months ago and it is constant. No recent fall, injuries, or trauma. She states that she has tried Tylenol Arthritis, Ibuprofen, and Oxycodone with no relief. Pt had an emergency C-section 3 months ago. She denies any lower leg pain, abdominal pain, CP, back pain, rash, or open wounds,     Past Medical History  Diagnosis Date  . Medical history non-contributory   . Hypertension   . Trichomonal vaginitis in pregnancy in second trimester 02/17/2015  . HSV-2 seropositive    Past Surgical History  Procedure Laterality Date  . No past surgeries    . Cesarean section N/A 07/28/2015    Procedure: CESAREAN SECTION;  Surgeon: Truett Mainland, DO;  Location: [facility];  Service: Obstetrics;  Laterality: N/A;   Family History  Problem Relation Age of Onset  . Cancer Mother   . Hyperlipidemia Brother    Social History  Substance Use Topics  . Smoking status: Former Smoker    Types: Cigarettes  . Smokeless tobacco: Never Used     Comment: 1-2 cig daily  . Alcohol Use: No     Comment: not now   OB History    Gravida Para Term Preterm AB TAB SAB Ectopic  Multiple Living   1 1 1       0 1     Review of Systems  Cardiovascular: Negative for chest pain.  Gastrointestinal: Negative for nausea, vomiting and abdominal pain.  Musculoskeletal: Positive for arthralgias (lateral aspect of left thigh). Negative for back pain.  Skin: Negative for rash and wound.  All other systems reviewed and are negative.     Allergies  Review of patient's allergies indicates no known allergies.  Home Medications   Prior to Admission medications   Medication Sig Start Date End Date Taking? Authorizing Provider  amLODipine (NORVASC) 5 MG tablet Take 5 mg by mouth daily. 11/09/15  Yes Historical Provider, MD  VIORELE 0.15-0.02/0.01 MG (21/5) tablet Take 1 tablet by mouth daily. 11/01/15  Yes Historical Provider, MD  ibuprofen (ADVIL,MOTRIN) 800 MG tablet Take 1 tablet (800 mg total) by mouth 3 (three) times daily. 11/15/15   Noemi Chapel, MD  methocarbamol (ROBAXIN) 500 MG tablet Take 1 tablet (500 mg total) by mouth 4 (four) times daily. 11/15/15   Noemi Chapel, MD   BP 124/60 mmHg  Pulse 64  Temp(Src) 98.8 F (37.1 C) (Oral)  Resp 17  Ht 5\' 8"  (1.727 m)  Wt 325 lb (147.419 kg)  BMI 49.43 kg/m2  SpO2 99%  LMP 11/01/2015 Physical Exam  Constitutional: She appears well-developed and well-nourished. No distress.  HENT:  Head: Normocephalic and atraumatic.  Mouth/Throat: Oropharynx is clear and moist. No oropharyngeal exudate.  Eyes: Conjunctivae and EOM are normal. Pupils are equal, round, and reactive to light. Right eye exhibits no discharge. Left eye exhibits no discharge. No scleral icterus.  Neck: Normal range of motion. Neck supple. No JVD present. No thyromegaly present.  Cardiovascular: Normal rate, regular rhythm, normal heart sounds and intact distal pulses.  Exam reveals no gallop and no friction rub.   No murmur heard. Pulmonary/Chest: Effort normal and breath sounds normal. No respiratory distress. She has no wheezes. She has no rales.   Abdominal: Soft. Bowel sounds are normal. She exhibits no distension and no mass. There is no tenderness.  Musculoskeletal: Normal range of motion. She exhibits tenderness. She exhibits no edema.  Proximal anterior lateral thigh  Straight leg raise is normal Supple joints distal to the hip - soft compartment diffusel Morbidly obese - normal pulses in the feet  Lymphadenopathy:    She has no cervical adenopathy.  Neurological: She is alert. Coordination normal.  No numnbess or weakness of the bilateral LE's.  Skin: Skin is warm and dry. No rash noted. No erythema.  Psychiatric: She has a normal mood and affect. Her behavior is normal.  Nursing note and vitals reviewed.   ED Course  Procedures (including critical care time) DIAGNOSTIC STUDIES: Oxygen Saturation is 100% on RA, normal by my interpretation.    COORDINATION OF CARE: 12:06 PM- Pt advised of plan for treatment and pt agrees. Pt will receive x-ray for further evaluation.  Imaging Review Dg Femur Min 2 Views Left  11/15/2015  CLINICAL DATA:  Left upper leg pain.  No known injury. EXAM: LEFT FEMUR 2 VIEWS COMPARISON:  None. FINDINGS: Mild to moderate left femoral head and neck junction spur formation. Marked superior left hip joint narrowing. Lateral knee joint space narrowing and mild spur formation. Minimal posterior patellar spur formation. 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 Electronically Signed   By: Claudie Revering M.D.   On: 11/15/2015 13:36     Noemi Chapel, MD has personally reviewed and evaluated these images as part of his medical decision-making.  MDM   Final diagnoses:  Pain of left thigh   I personally performed the services described in this documentation, which was scribed in my presence. The recorded information has been reviewed and is accurate.   The pt has reproducible ttp in the thigh - no signs of trauma, this has been going on intermittent for over a year - she denies numbness or  weakness and has normal MSK exam with supple joints and only mild to moderate ttp in the said muscle.  Imaging to r/o soft tissue / bony issues  Pt has already had multiple tx from PCP including nsaids and oxy without relief.  I would be hesitant to push pain meds at this point - add muscle relaxer to nsaids and heat - pt encouraged to lose weight - she is agreeable but is adamant that she doesn't think that her weight has anything to do with it - she is 325 lb.  I dont' think that her menstrual cycle has anything to do with this either despite her claims that this used to come on during her cycle in the past.  Pt informed of normal xray  I have personally viewed and interpreted the imaging and agree with radiologist interpretation.   Meds given in ED:  Medications  ibuprofen (ADVIL,MOTRIN) tablet 800 mg (800 mg Oral Given  11/15/15 1214)    New Prescriptions   IBUPROFEN (ADVIL,MOTRIN) 800 MG TABLET    Take 1 tablet (800 mg total) by mouth 3 (three) times daily.   METHOCARBAMOL (ROBAXIN) 500 MG TABLET    Take 1 tablet (500 mg total) by mouth 4 (four) times daily.      Noemi Chapel, MD 11/15/15 [phone]

## 2015-11-18 ENCOUNTER — Emergency Department (HOSPITAL_COMMUNITY)
Admission: EM | Admit: 2015-11-18 | Discharge: 2015-11-18 | Disposition: A | Discharge status: Home / Self Care | Payer: BLUE CROSS/BLUE SHIELD

## 2015-11-18 NOTE — ED Notes (Signed)
Patient stated she was leaving. 

## 2015-11-21 ENCOUNTER — Ambulatory Visit (INDEPENDENT_AMBULATORY_CARE_PROVIDER_SITE_OTHER): Payer: BLUE CROSS/BLUE SHIELD | Admitting: Obstetrics & Gynecology

## 2015-11-21 ENCOUNTER — Encounter: Payer: Self-pay | Admitting: Obstetrics & Gynecology

## 2015-11-21 VITALS — BP 128/70 | HR 60 | Wt 323.0 lb

## 2015-11-21 DIAGNOSIS — N760 Acute vaginitis: Secondary | ICD-10-CM

## 2015-11-21 DIAGNOSIS — A499 Bacterial infection, unspecified: Secondary | ICD-10-CM | POA: Diagnosis not present

## 2015-11-21 DIAGNOSIS — B9689 Other specified bacterial agents as the cause of diseases classified elsewhere: Secondary | ICD-10-CM

## 2015-11-21 MED ORDER — METRONIDAZOLE 0.75 % VA GEL: VAGINAL | Status: DC

## 2015-11-21 NOTE — Progress Notes (Signed)
Patient ID: Olivia Brooks, female   DOB: [birth year], 35 y.o.   MRN: [id number]       Chief Complaint  Patient presents with  . Follow-up    condyloma/ vaginal odor    Blood pressure 128/70, pulse 60, weight 323 lb (146.512 kg), last menstrual period 11/01/2015, not currently breastfeeding.  35 y.o. G1P1001 Patient's last menstrual period was 11/01/2015. The current method of family planning is OCP (estrogen/progesterone).  Subjective Vaginal discharge for 1weeks Itching no Irritation yes Odor yes Similar to previous yes  Previous treatment none  Objective Vulva:  normal appearing vulva with no masses, tenderness or lesions Vagina:  normal mucosa, thin grey discharge Cervix:  no cervical motion tenderness and no lesions Uterus:  normal size, contour, position, consistency, mobility, non-tender Adnexa: ovaries:,       Pertinent ROS   Labs or studies Wet Prep:   A sample of vaginal discharge was obtained from the posterior fornix using a cotton swab. 2 drops of saline were placed on a slide and the cotton swab was immersed in the saline. Microscopic evaluation was performed and results were as follows:  Negative for   for yeast  Positive for clue cells , consistent with Bacterial vaginosis Negative for trichomonas  Normal WBC population   Whiff test: Positive     Impression Diagnoses this Encounter::   ICD-9-CM ICD-10-CM   1. BV (bacterial vaginosis) 616.10 N76.0    041.9 A49.9     Established relevant diagnosis(es):   Plan/Recommendations: Meds ordered this encounter  Medications  . meloxicam (MOBIC) 15 MG tablet    Sig: Take 15 mg by mouth daily.  . metroNIDAZOLE (METROGEL VAGINAL) 0.75 % vaginal gel    Sig: Nightly x 5 nights    Dispense:  70 g    Refill:  0    Labs or Scans Ordered: No orders of the defined types were placed in this encounter.    Management::   Follow up Return if symptoms worsen or fail to improve.    All  questions were answered.

## 2015-11-25 ENCOUNTER — Other Ambulatory Visit: Payer: Self-pay | Admitting: *Deleted

## 2015-11-25 MED ORDER — TRIAMTERENE-HCTZ 75-50 MG PO TABS: 1.0000 | ORAL_TABLET | Freq: Every day | ORAL | Status: DC

## 2016-05-21 ENCOUNTER — Other Ambulatory Visit: Payer: Self-pay | Admitting: Physician Assistant

## 2016-05-21 DIAGNOSIS — Z01818 Encounter for other preprocedural examination: Secondary | ICD-10-CM

## 2016-05-24 ENCOUNTER — Encounter: Payer: Self-pay | Admitting: Adult Health

## 2016-05-24 ENCOUNTER — Ambulatory Visit (INDEPENDENT_AMBULATORY_CARE_PROVIDER_SITE_OTHER): Payer: BLUE CROSS/BLUE SHIELD | Admitting: Adult Health

## 2016-05-24 VITALS — BP 194/92 | HR 76 | Ht 68.5 in | Wt 329.0 lb

## 2016-05-24 DIAGNOSIS — A599 Trichomoniasis, unspecified: Secondary | ICD-10-CM | POA: Diagnosis not present

## 2016-05-24 DIAGNOSIS — N9489 Other specified conditions associated with female genital organs and menstrual cycle: Secondary | ICD-10-CM

## 2016-05-24 DIAGNOSIS — N898 Other specified noninflammatory disorders of vagina: Secondary | ICD-10-CM | POA: Diagnosis not present

## 2016-05-24 LAB — POCT WET PREP (WET MOUNT): WBC, Wet Prep HPF POC: POSITIVE

## 2016-05-24 MED ORDER — METRONIDAZOLE 500 MG PO TABS: ORAL_TABLET | ORAL | 0 refills | Status: DC

## 2016-05-24 NOTE — Patient Instructions (Signed)
No alcohol or sex F/u in 10 days

## 2016-05-24 NOTE — Progress Notes (Signed)
Subjective:     Patient ID: Olivia Brooks, female   DOB: [birth year], 35 y.o.   MRN: [id number]  HPI Mical is a 35 year old black female in complaining of vaginal discharge with odor for about 7 months now, she says has not been right since sons birth 9 months ago.   Review of Systems +vaginal discharge + vaginal odor Reviewed past medical,surgical, social and family history. Reviewed medications and allergies.     Objective:   Physical Exam BP (!) 194/92   Pulse 76   Ht 5' 8.5" (1.74 m)   Wt (!) 329 lb (149.2 kg)   LMP 05/10/2016 (Approximate)   Breastfeeding? No   BMI 49.30 kg/m  Skin warm and dry.Pelvic: external genitalia is normal in appearance no lesions, vagina: white discharge with odor,urethra has no lesions or masses noted, cervix:smooth and bulbous, uterus: normal size, shape and contour, non tender, no masses felt, adnexa: no masses or tenderness noted. Bladder is non tender and no masses felt. Wet prep: + for trich and +WBCs.PHQ 2 score 0.    Assessment:     1. Vaginal discharge   2. Vaginal odor   3. Trichimoniasis       Plan:     Rx flagyl 500 mg #21 take 1 tid x 7 days No sex or alcohol Follow up in 10 days for POT

## 2016-05-26 ENCOUNTER — Ambulatory Visit
Admission: RE | Admit: 2016-05-26 | Discharge: 2016-05-26 | Disposition: A | Discharge status: Home / Self Care | Payer: BLUE CROSS/BLUE SHIELD | Source: Ambulatory Visit | Attending: Physician Assistant | Admitting: Physician Assistant

## 2016-05-26 DIAGNOSIS — Z01818 Encounter for other preprocedural examination: Secondary | ICD-10-CM

## 2016-06-03 ENCOUNTER — Encounter: Payer: Self-pay | Admitting: Adult Health

## 2016-06-03 ENCOUNTER — Ambulatory Visit (INDEPENDENT_AMBULATORY_CARE_PROVIDER_SITE_OTHER): Payer: BLUE CROSS/BLUE SHIELD | Admitting: Adult Health

## 2016-06-03 VITALS — BP 162/100 | HR 74 | Ht 68.5 in | Wt 327.2 lb

## 2016-06-03 DIAGNOSIS — Z8619 Personal history of other infectious and parasitic diseases: Secondary | ICD-10-CM | POA: Diagnosis not present

## 2016-06-03 DIAGNOSIS — I10 Essential (primary) hypertension: Secondary | ICD-10-CM | POA: Diagnosis not present

## 2016-06-03 LAB — POCT WET PREP (WET MOUNT)
TRICHOMONAS WET PREP HPF POC: ABSENT
WBC WET PREP: NEGATIVE

## 2016-06-03 MED ORDER — HYDROCHLOROTHIAZIDE 25 MG PO TABS: 25.0000 mg | ORAL_TABLET | Freq: Every day | ORAL | 1 refills | Status: DC

## 2016-06-03 NOTE — Patient Instructions (Signed)
Decrease salt and sugars Add meds to losartan  Follow up in 2 weeks DASH Eating Plan DASH stands for "Dietary Approaches to Stop Hypertension." The DASH eating plan is a healthy eating plan that has been shown to reduce high blood pressure (hypertension). Additional health benefits may include reducing the risk of type 2 diabetes mellitus, heart disease, and stroke. The DASH eating plan may also help with weight loss. WHAT DO I NEED TO KNOW ABOUT THE DASH EATING PLAN? For the DASH eating plan, you will follow these general guidelines:  Choose foods with a percent daily value for sodium of less than 5% (as listed on the food label).  Use salt-free seasonings or herbs instead of table salt or sea salt.  Check with your health care provider or pharmacist before using salt substitutes.  Eat lower-sodium products, often labeled as "lower sodium" or "no salt added."  Eat fresh foods.  Eat more vegetables, fruits, and low-fat dairy products.  Choose whole grains. Look for the word "whole" as the first word in the ingredient list.  Choose fish and skinless chicken or [place] more often than red meat. Limit fish, poultry, and meat to 6 oz (170 g) each day.  Limit sweets, desserts, sugars, and sugary drinks.  Choose heart-healthy fats.  Limit cheese to 1 oz (28 g) per day.  Eat more home-cooked food and less restaurant, buffet, and fast food.  Limit fried foods.  Cook foods using methods other than frying.  Limit canned vegetables. If you do use them, rinse them well to decrease the sodium.  When eating at a restaurant, ask that your food be prepared with less salt, or no salt if possible. WHAT FOODS CAN I EAT? Seek help from a dietitian for individual calorie needs. Grains Whole grain or whole wheat bread. Brown rice. Whole grain or whole wheat pasta. Quinoa, bulgur, and whole grain cereals. Low-sodium cereals. Corn or whole wheat flour tortillas. Whole grain cornbread. Whole grain  crackers. Low-sodium crackers. Vegetables Fresh or frozen vegetables (raw, steamed, roasted, or grilled). Low-sodium or reduced-sodium tomato and vegetable juices. Low-sodium or reduced-sodium tomato sauce and paste. Low-sodium or reduced-sodium canned vegetables.  Fruits All fresh, canned (in natural juice), or frozen fruits. Meat and Other Protein Products Ground beef (85% or leaner), grass-fed beef, or beef trimmed of fat. Skinless chicken or [place]. Ground chicken or [place]. Pork trimmed of fat. All fish and seafood. Eggs. Dried beans, peas, or lentils. Unsalted nuts and seeds. Unsalted canned beans. Dairy Low-fat dairy products, such as skim or 1% milk, 2% or reduced-fat cheeses, low-fat ricotta or cottage cheese, or plain low-fat yogurt. Low-sodium or reduced-sodium cheeses. Fats and Oils Tub margarines without trans fats. Light or reduced-fat mayonnaise and salad dressings (reduced sodium). Avocado. Safflower, olive, or canola oils. Natural peanut or almond butter. Other Unsalted popcorn and pretzels. The items listed above may not be a complete list of recommended foods or beverages. Contact your dietitian for more options. WHAT FOODS ARE NOT RECOMMENDED? Grains White bread. White pasta. White rice. Refined cornbread. Bagels and croissants. Crackers that contain trans fat. Vegetables Creamed or fried vegetables. Vegetables in a cheese sauce. Regular canned vegetables. Regular canned tomato sauce and paste. Regular tomato and vegetable juices. Fruits Dried fruits. Canned fruit in light or heavy syrup. Fruit juice. Meat and Other Protein Products Fatty cuts of meat. Ribs, chicken wings, bacon, sausage, bologna, salami, chitterlings, fatback, hot dogs, bratwurst, and packaged luncheon meats. Salted nuts and seeds. Canned beans with salt. Dairy  Whole or 2% milk, cream, half-and-half, and cream cheese. Whole-fat or sweetened yogurt. Full-fat cheeses or blue cheese. Nondairy creamers and  whipped toppings. Processed cheese, cheese spreads, or cheese curds. Condiments Onion and garlic salt, seasoned salt, table salt, and sea salt. Canned and packaged gravies. Worcestershire sauce. Tartar sauce. Barbecue sauce. Teriyaki sauce. Soy sauce, including reduced sodium. Steak sauce. Fish sauce. Oyster sauce. Cocktail sauce. Horseradish. Ketchup and mustard. Meat flavorings and tenderizers. Bouillon cubes. Hot sauce. Tabasco sauce. Marinades. Taco seasonings. Relishes. Fats and Oils Butter, stick margarine, lard, shortening, ghee, and bacon fat. Coconut, palm kernel, or palm oils. Regular salad dressings. Other Pickles and olives. Salted popcorn and pretzels. The items listed above may not be a complete list of foods and beverages to avoid. Contact your dietitian for more information. WHERE CAN I FIND MORE INFORMATION? National Heart, Lung, and Blood Institute: travelstabloid.com   This information is not intended to replace advice given to you by your health care provider. Make sure you discuss any questions you have with your health care provider.   Document Released: 08/05/2011 Document Revised: 09/06/2014 Document Reviewed: 06/20/2013 Elsevier Interactive Patient Education 2016 [employer].  Hypertension Hypertension, commonly called high blood pressure, is when the force of blood pumping through your arteries is too strong. Your arteries are the blood vessels that carry blood from your heart throughout your body. A blood pressure reading consists of a higher number over a lower number, such as 110/72. The higher number (systolic) is the pressure inside your arteries when your heart pumps. The lower number (diastolic) is the pressure inside your arteries when your heart relaxes. Ideally you want your blood pressure below 120/80. Hypertension forces your heart to work harder to pump blood. Your arteries may become narrow or stiff. Having untreated or  uncontrolled hypertension can cause heart attack, stroke, kidney disease, and other problems. RISK FACTORS Some risk factors for high blood pressure are controllable. Others are not.  Risk factors you cannot control include:   Race. You may be at higher risk if you are African American.  Age. Risk increases with age.  Gender. Men are at higher risk than women before age 30 years. After age 69, women are at higher risk than men. Risk factors you can control include:  Not getting enough exercise or physical activity.  Being overweight.  Getting too much fat, sugar, calories, or salt in your diet.  Drinking too much alcohol. SIGNS AND SYMPTOMS Hypertension does not usually cause signs or symptoms. Extremely high blood pressure (hypertensive crisis) may cause headache, anxiety, shortness of breath, and nosebleed. DIAGNOSIS To check if you have hypertension, your health care provider will measure your blood pressure while you are seated, with your arm held at the level of your heart. It should be measured at least twice using the same arm. Certain conditions can cause a difference in blood pressure between your right and left arms. A blood pressure reading that is higher than normal on one occasion does not mean that you need treatment. If it is not clear whether you have high blood pressure, you may be asked to return on a different day to have your blood pressure checked again. Or, you may be asked to monitor your blood pressure at home for 1 or more weeks. TREATMENT Treating high blood pressure includes making lifestyle changes and possibly taking medicine. Living a healthy lifestyle can help lower high blood pressure. You may need to change some of your habits. Lifestyle changes may include:  Following the DASH diet. This diet is high in fruits, vegetables, and whole grains. It is low in salt, red meat, and added sugars.  Keep your sodium intake below 2,300 mg per day.  Getting at least  30-45 minutes of aerobic exercise at least 4 times per week.  Losing weight if necessary.  Not smoking.  Limiting alcoholic beverages.  Learning ways to reduce stress. Your health care provider may prescribe medicine if lifestyle changes are not enough to get your blood pressure under control, and if one of the following is true:  You are 3-55 years of age and your systolic blood pressure is above 140.  You are 55 years of age or older, and your systolic blood pressure is above 150.  Your diastolic blood pressure is above 90.  You have diabetes, and your systolic blood pressure is over [id number] or your diastolic blood pressure is over 90.  You have kidney disease and your blood pressure is above 140/90.  You have heart disease and your blood pressure is above 140/90. Your personal target blood pressure may vary depending on your medical conditions, your age, and other factors. HOME CARE INSTRUCTIONS  Have your blood pressure rechecked as directed by your health care provider.   Take medicines only as directed by your health care provider. Follow the directions carefully. Blood pressure medicines must be taken as prescribed. The medicine does not work as well when you skip doses. Skipping doses also puts you at risk for problems.  Do not smoke.   Monitor your blood pressure at home as directed by your health care provider. SEEK MEDICAL CARE IF:   You think you are having a reaction to medicines taken.  You have recurrent headaches or feel dizzy.  You have swelling in your ankles.  You have trouble with your vision. SEEK IMMEDIATE MEDICAL CARE IF:  You develop a severe headache or confusion.  You have unusual weakness, numbness, or feel faint.  You have severe chest or abdominal pain.  You vomit repeatedly.  You have trouble breathing. MAKE SURE YOU:   Understand these instructions.  Will watch your condition.  Will get help right away if you are not doing well  or get worse.   This information is not intended to replace advice given to you by your health care provider. Make sure you discuss any questions you have with your health care provider.   Document Released: 08/16/2005 Document Revised: 12/31/2014 Document Reviewed: 06/08/2013 Elsevier Interactive Patient Education [employer].

## 2016-06-03 NOTE — Progress Notes (Signed)
Subjective:     Patient ID: Olivia Brooks, female   DOB: [birth year], 35 y.o.   MRN: [id number]  HPI Ramonda is a 35 year old black female in for proof of cure for recent trich.  Review of Systems Patient denies any headaches, hearing loss, fatigue, blurred vision, shortness of breath, chest pain, abdominal pain, problems with bowel movements, urination, or intercourse(not having sex). No  mood swings.+ pain in hip, hopes to get surgery soon and also hopes to get sleeve by pass.   Reviewed past medical,surgical, social and family history. Reviewed medications and allergies.  Objective:   Physical Exam BP (!) 162/100   Pulse 74   Ht 5' 8.5" (1.74 m)   Wt (!) 327 lb 3.2 oz (148.4 kg)   LMP 05/10/2016 (Approximate)   Breastfeeding? No   BMI 49.03 kg/m    Skin warm and dry.  Lungs: clear to ausculation bilaterally. Cardiovascular: regular rate and rhythm.Pelvic: external genitalia is normal in appearance no lesions, vagina: white discharge without odor,urethra has no lesions or masses noted, cervix:smooth and bulbous, uterus: normal size, shape and contour, non tender, no masses felt, adnexa: no masses or tenderness noted. Bladder is non tender and no masses felt. Wet prep:negative for [employer]. PHQ 2 score 0.  Assessment:     1. History of trichomoniasis   2. Hypertension, essential, benign       Plan:     Rx hydrodiuril 25 mg #30 take 1 daily with 1 refill Continue Losartan Decrease salt and sugar Review handout on DASH diet and hypertension Follow up in 2 weeks

## 2016-06-17 ENCOUNTER — Ambulatory Visit: Payer: BLUE CROSS/BLUE SHIELD | Admitting: Adult Health

## 2016-06-22 ENCOUNTER — Encounter: Payer: Self-pay | Admitting: Adult Health

## 2016-06-22 ENCOUNTER — Ambulatory Visit (INDEPENDENT_AMBULATORY_CARE_PROVIDER_SITE_OTHER): Payer: BLUE CROSS/BLUE SHIELD | Admitting: Adult Health

## 2016-06-22 VITALS — BP 140/60 | HR 68 | Ht 68.5 in | Wt 317.8 lb

## 2016-06-22 DIAGNOSIS — I10 Essential (primary) hypertension: Secondary | ICD-10-CM

## 2016-06-22 DIAGNOSIS — E669 Obesity, unspecified: Secondary | ICD-10-CM | POA: Diagnosis not present

## 2016-06-22 DIAGNOSIS — Z6841 Body Mass Index (BMI) 40.0 and over, adult: Secondary | ICD-10-CM

## 2016-06-22 MED ORDER — HYDROCHLOROTHIAZIDE 25 MG PO TABS: 25.0000 mg | ORAL_TABLET | Freq: Every day | ORAL | 3 refills | Status: DC

## 2016-06-22 NOTE — Patient Instructions (Signed)
Follow up with PCP in 3 months for BP Praised over weight loss

## 2016-06-22 NOTE — Progress Notes (Signed)
Subjective:     Patient ID: Olivia Brooks, female   DOB: 1981-02-17, 35 y.o.   MRN: [id number]  HPI Olivia Brooks is a 35 year old black female back in for BP check, was placed on hydrodiuril 10/5 in conjunction with losartin. And her BP is much better and she has lost 10 lbs.She has cut out meat and is using protein shakes and trying to walk more. She is still hoping to get sleeve surgery the first of the year.  Review of Systems Patient denies any headaches, hearing loss, fatigue, blurred vision, shortness of breath, chest pain, abdominal pain, problems with bowel movements, urination, or intercourse. No joint pain or mood swings.Feels good Reviewed past medical,surgical, social and family history. Reviewed medications and allergies.     Objective:   Physical Exam BP 140/60 (BP Location: Left Arm, Patient Position: Sitting, Cuff Size: Large)   Pulse 68   Ht 5' 8.5" (1.74 m)   Wt (!) 317 lb 12.8 oz (144.2 kg)   LMP 05/11/2015 (Approximate)   Breastfeeding? No   BMI 47.62 kg/m  Skin warm and dry. Lungs: clear to ausculation bilaterally. Cardiovascular: regular rate and rhythm.PHQ 2 score 0. Will continue hydrodiuril with losartin and continue weight loss efforts.Praised over her results.    Assessment:     1. Hypertension, essential, benign       Plan:    Continue weight loss efforts Refilled hydrodiuril 25 mg #30 take 1 daily with 3 refills Continue losartin Follow up in 3 months with PCP about BP

## 2016-07-19 ENCOUNTER — Other Ambulatory Visit: Payer: Self-pay | Admitting: *Deleted

## 2016-07-19 IMAGING — US US OB TRANSVAGINAL
1 series · 14 of 28 positions shown · non-contrast
Comparison: None.

CLINICAL DATA: Back pain, pregnant.

EXAM:
OBSTETRIC <14 WK US AND TRANSVAGINAL OB US
TECHNIQUE: Both transabdominal and transvaginal ultrasound examinations were
performed for complete evaluation of the gestation as well as the
maternal uterus, adnexal regions, and pelvic cul-de-sac.
Transvaginal technique was performed to assess early pregnancy.

[Series 1: us ob transvaginal · 0.20mm/px · 99 acquisitions, 14 frames shown]
[im 4/99]
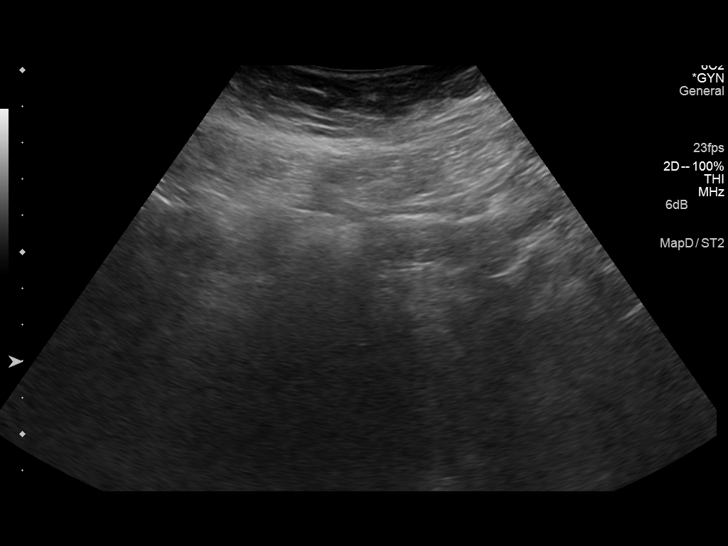
[im 11/99]
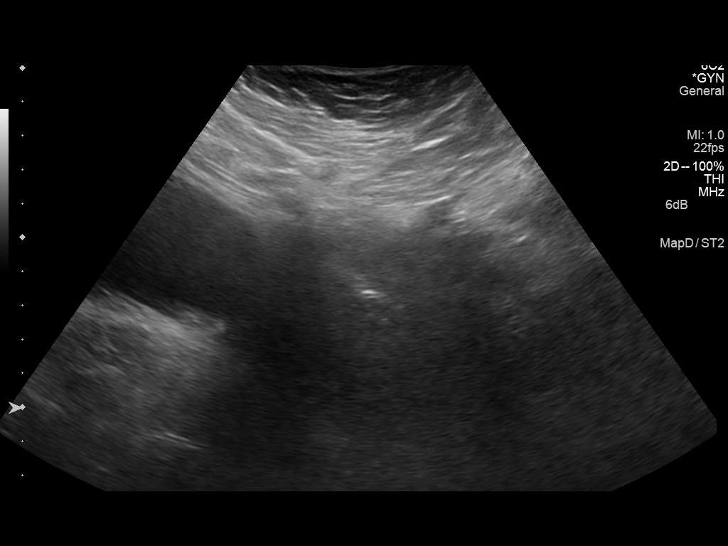
[im 19/99]
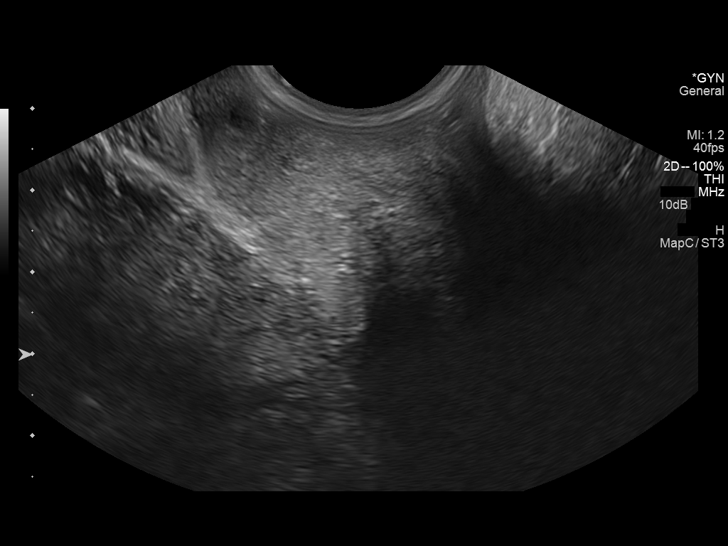
[im 26/99]
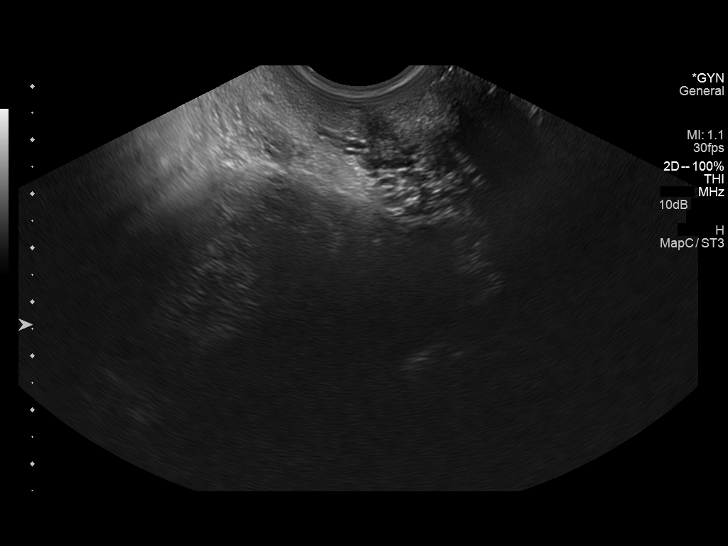
[im 33/99]
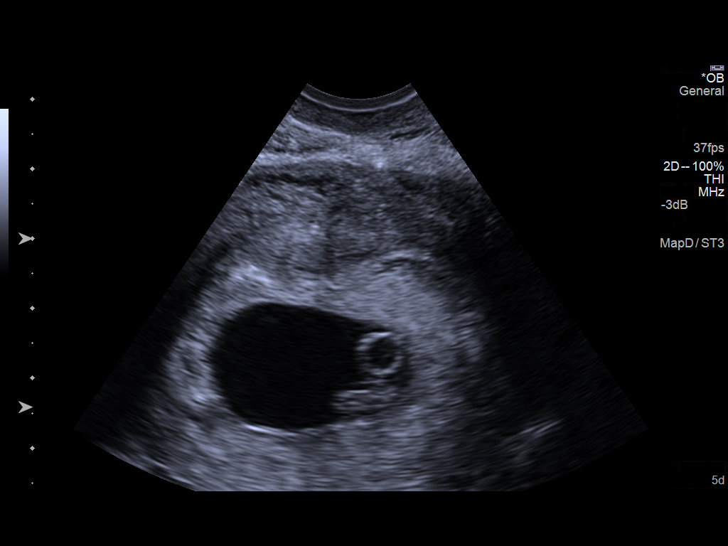
[im 40/99]
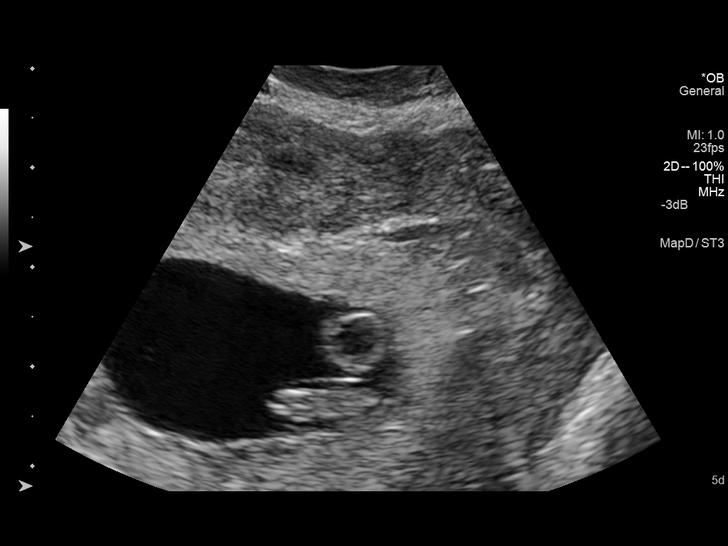
[im 48/99]
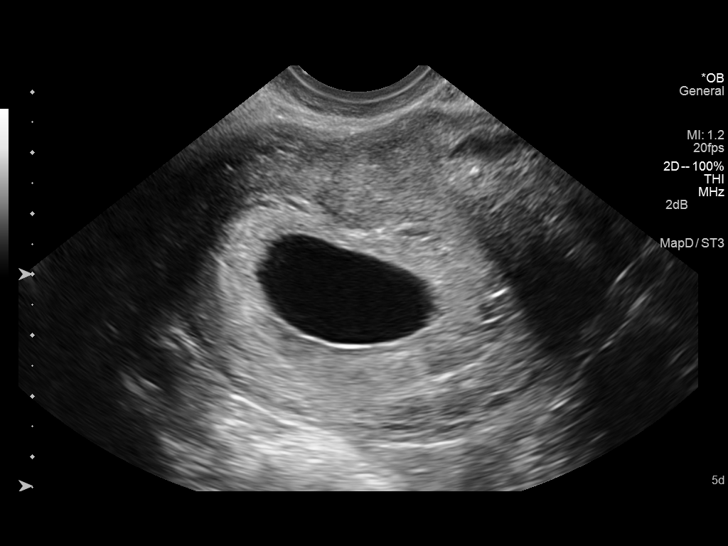
[im 55/99]
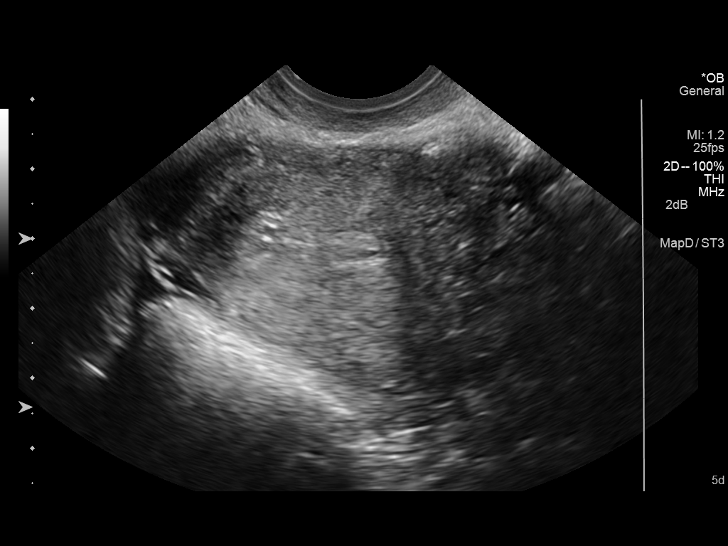
[im 62/99]
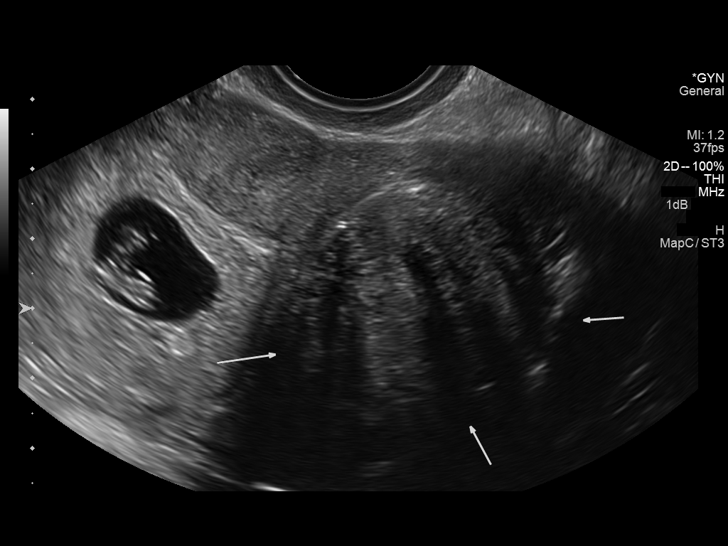
[im 69/99]
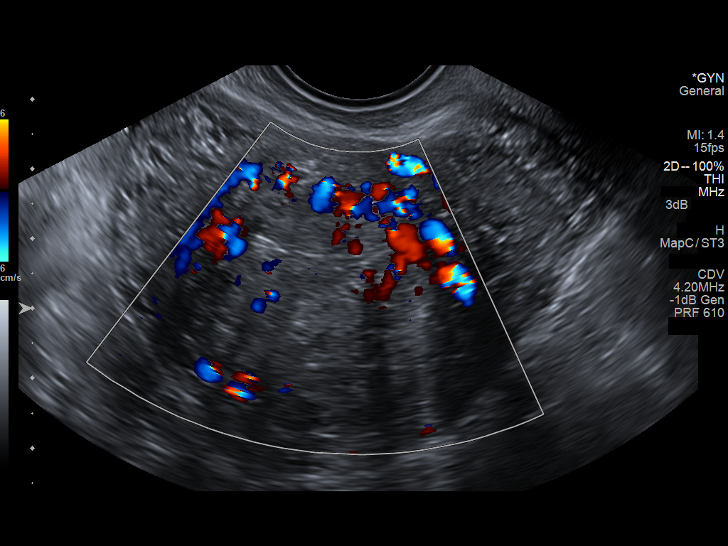
[im 77/99]
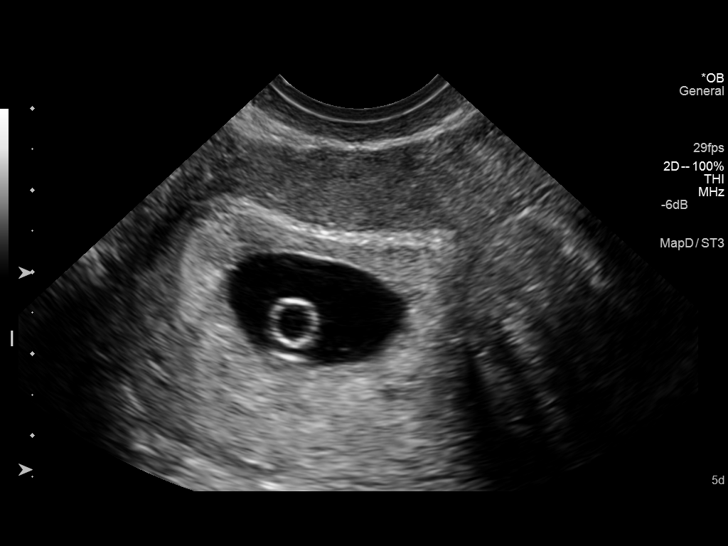
[im 84/99]
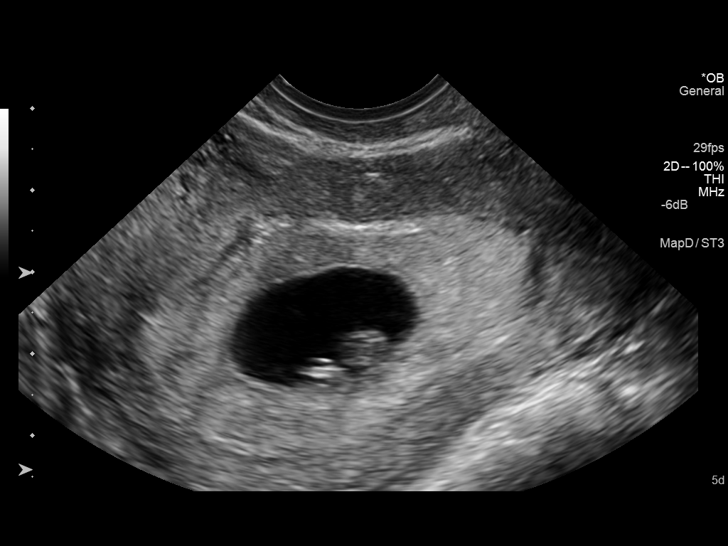
[im 91/99]
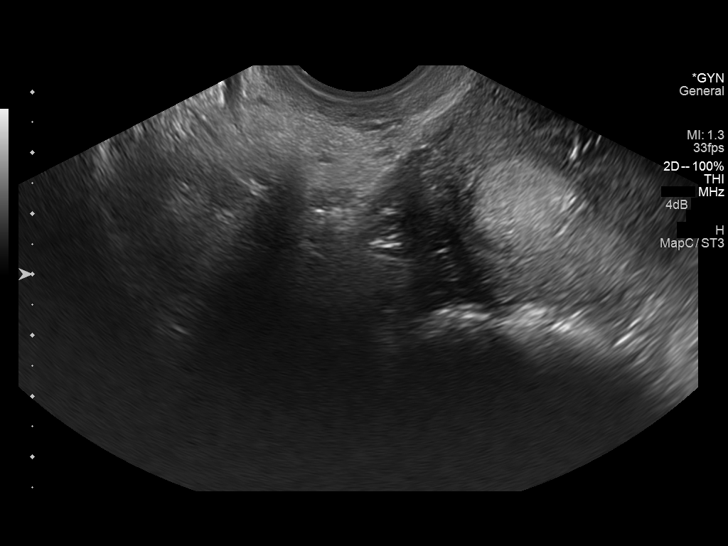
[im 99/99]
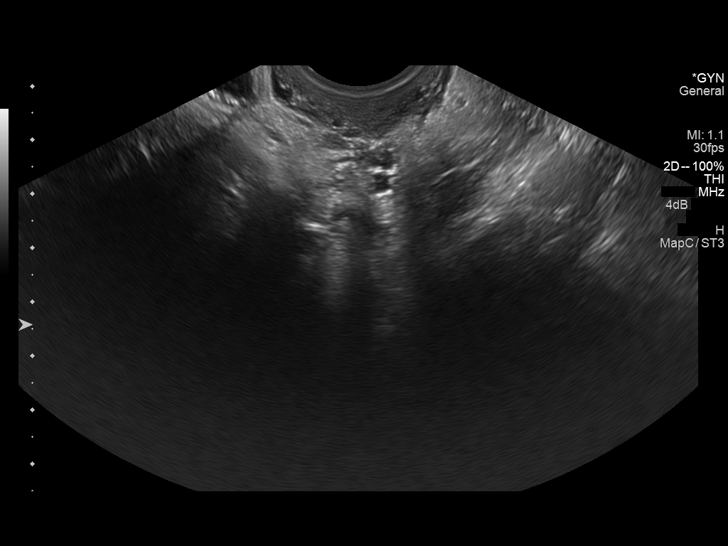

[14 of 28 positions shown; findings below may reference images not displayed]

FINDINGS: Intrauterine gestational sac: Visualized/normal in shape.

Yolk sac:  Identified

Embryo:  Identified

Cardiac Activity: Identified

Heart Rate: 145  bpm

CRL:  12  mm   7 w   3 d                  US EDC: 08/04/2015

Maternal uterus/adnexae: No subchorionic hemorrhage. Intramural
fibroid on the left measuring 3.8 x 3.3 x 3.8 cm. Ovaries not
visualized. Trace free fluid.
IMPRESSION: Single intrauterine gestation with cardiac activity documented.
Estimated age of 7 weeks 3 days by crown-rump length.

Fibroid uterus.

Ovaries not visualized.  Trace free fluid.

## 2016-07-19 MED ORDER — HYDROCHLOROTHIAZIDE 25 MG PO TABS: 25.0000 mg | ORAL_TABLET | Freq: Every day | ORAL | 3 refills | Status: DC

## 2016-12-01 ENCOUNTER — Encounter: Payer: Self-pay | Admitting: *Deleted

## 2016-12-13 ENCOUNTER — Ambulatory Visit (INDEPENDENT_AMBULATORY_CARE_PROVIDER_SITE_OTHER): Payer: BLUE CROSS/BLUE SHIELD | Admitting: Obstetrics & Gynecology

## 2016-12-13 ENCOUNTER — Encounter: Payer: Self-pay | Admitting: Obstetrics & Gynecology

## 2016-12-13 VITALS — BP 120/80 | HR 80 | Wt 322.0 lb

## 2016-12-13 DIAGNOSIS — R87610 Atypical squamous cells of undetermined significance on cytologic smear of cervix (ASC-US): Secondary | ICD-10-CM | POA: Diagnosis not present

## 2016-12-13 DIAGNOSIS — R87810 Cervical high risk human papillomavirus (HPV) DNA test positive: Secondary | ICD-10-CM | POA: Diagnosis not present

## 2016-12-13 DIAGNOSIS — Z3202 Encounter for pregnancy test, result negative: Secondary | ICD-10-CM | POA: Diagnosis not present

## 2016-12-13 LAB — POCT URINE PREGNANCY: Preg Test, Ur: NEGATIVE

## 2016-12-13 NOTE — Progress Notes (Signed)
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 The prior pap showed ASCUS with POSITIVE high risk HPV.  Prior cervical/vaginal disease: normal exam without visible pathology. Prior cervical treatment: no treatment.  Smoker:  Yes.   New sexual partner:  No.  : time frame:    History of abnormal Pap: yes  Procedure Details  The risks and benefits of the procedure and Written informed consent obtained.  Speculum placed in vagina and excellent visualization of cervix achieved, cervix swabbed x 3 with acetic acid solution.  Findings: Cervix: no visible lesions, no mosaicism, no punctation and no abnormal vasculature; no biopsies taken. Vaginal inspection: normal without visible lesions. Vulvar colposcopy: vulvar colposcopy not performed.  Specimens: none  Complications: none.  Plan: Repeat Pap 1 year

## 2017-02-01 ENCOUNTER — Other Ambulatory Visit: Payer: Self-pay | Admitting: Obstetrics & Gynecology

## 2017-02-15 ENCOUNTER — Other Ambulatory Visit: Payer: Self-pay | Admitting: Obstetrics & Gynecology

## 2017-02-25 IMAGING — CR DG ABD PORTABLE 1V
2 series · 2 of 2 positions shown · non-contrast
Comparison: None.

CLINICAL DATA: Missing surgical needle at instrument count.
Emergent C-section delivery.

EXAM:
PORTABLE ABDOMEN - 1 VIEW

[view not recorded (1 of 2)]
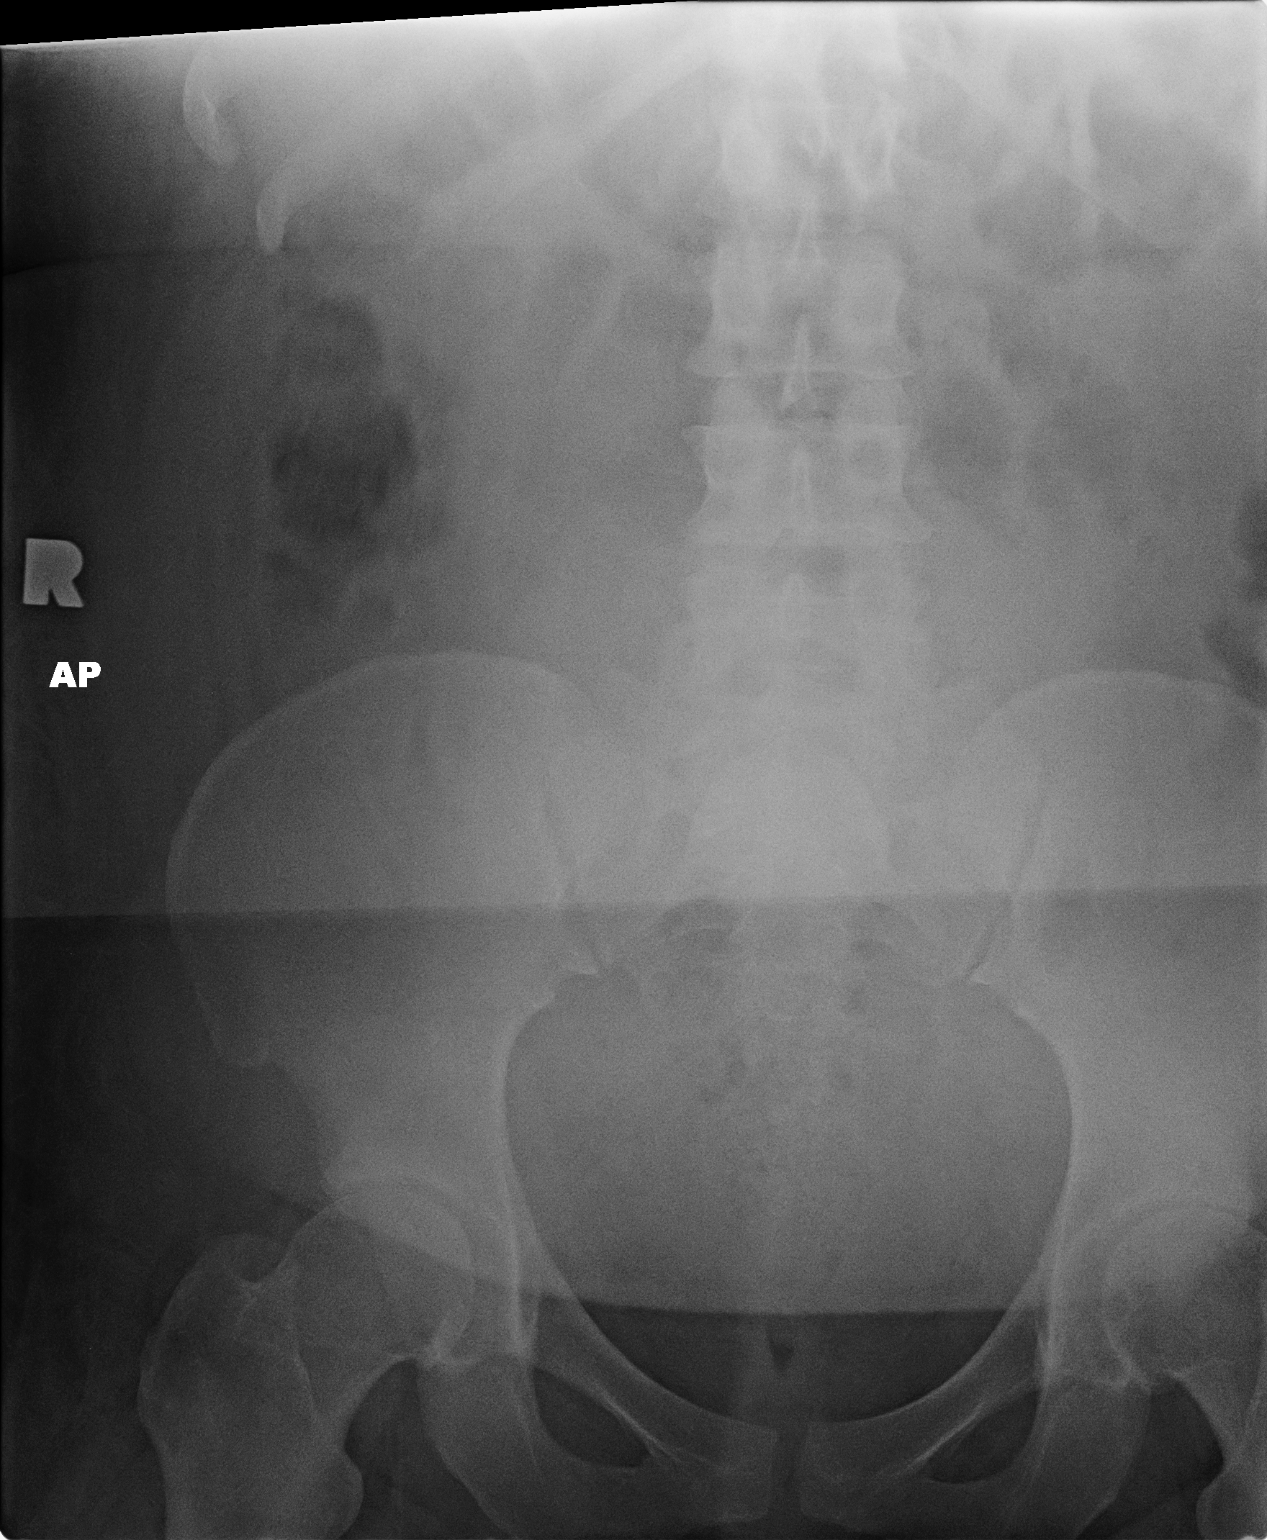

[view not recorded (2 of 2)]
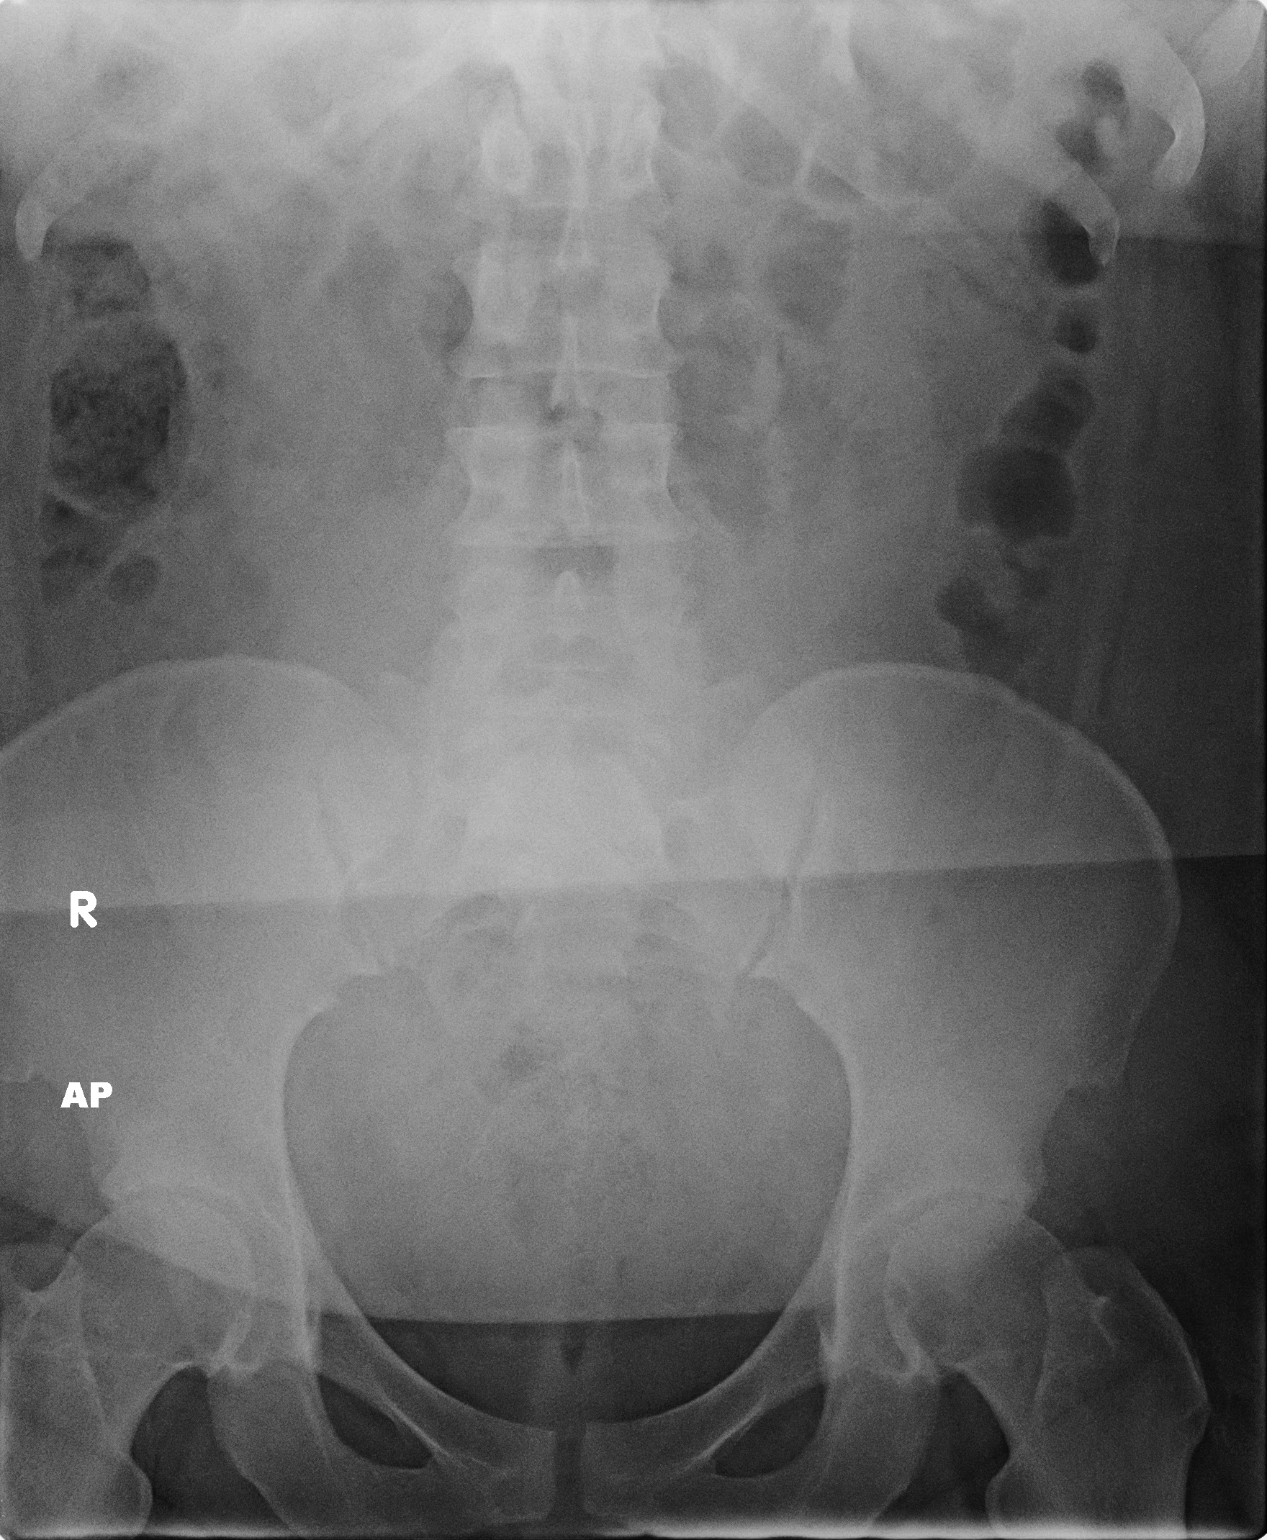

[2 of 2 positions shown; findings below may reference images not displayed]

FINDINGS: No surgical needle or other retained surgical instrument visualized.
No evidence of dilated bowel loops.
IMPRESSION: No surgical needle or foreign body visualized.

## 2017-04-14 ENCOUNTER — Encounter: Payer: BLUE CROSS/BLUE SHIELD | Attending: General Surgery | Admitting: Registered"

## 2017-04-14 ENCOUNTER — Encounter: Payer: Self-pay | Admitting: Registered"

## 2017-04-14 DIAGNOSIS — Z713 Dietary counseling and surveillance: Secondary | ICD-10-CM | POA: Insufficient documentation

## 2017-04-14 DIAGNOSIS — Z6841 Body Mass Index (BMI) 40.0 and over, adult: Secondary | ICD-10-CM | POA: Diagnosis not present

## 2017-04-14 DIAGNOSIS — E669 Obesity, unspecified: Secondary | ICD-10-CM

## 2017-04-14 NOTE — Progress Notes (Signed)
Pre-Op Assessment Visit:  Pre-Operative Sleeve gastrectomy Surgery  Medical Nutrition Therapy:  Appt start time: 2:00  End time:  2:55  Patient was seen on 04/14/2017 for Pre-Operative Nutrition Assessment. Assessment and letter of approval faxed to [facility] Program coordinator on 04/14/2017.  Pt expectation of surgery: to lose 100 lbs to have left hip replacement surgery   Pt expectation of Dietitian: make sure I'm on track with getting enough protein  Start weight at [facility]: 330.7 lbs BMI: 50.65   Pt arrives with 24 month old son. Pt states she is not really a breakfast person. Pt states this is her 3rd weight loss center to try and have surgery.   Pt is unsure if she needs SWL visits with [place] being that she has had done them with other weight loss programs. Pt states she will contact [facility] to verify how many visits she needs prior to surgery.   24 hr Dietary Recall: First Meal: skips Snack: yogurt Second Meal: skips Snack: chips Third Meal: pizza or tacos Snack: goldfish or cheese puffs Beverages: water, ginger ale (sometimes), sweet tea  Encouraged to engage in 150 minutes of moderate physical activity including cardiovascular and weight baring weekly  Handouts given during visit include:  . Pre-Op Goals . Bariatric Surgery Protein Shakes . Vitamin and Mineral Recommendations  During the appointment today the following Pre-Op Goals were reviewed with the patient: . Maintain or lose weight as instructed by your surgeon . Make healthy food choices . Begin to limit portion sizes . Limited concentrated sugars and fried foods . Keep fat/sugar in the single digits per serving on         food l[abels . Practice CHEWING your food  (aim for 30 chews per bite or until applesauce consistency) . Practice not drinking 15 minutes before, during, and 30 minutes after each meal/snack . Avoid all carbonated beverages  . Avoid/limit caffeinated beverages   . Avoid all sugar-sweetened beverages . Consume 3 meals per day; eat every 3-5 hours . Make a list of non-food related activities . Aim for 64-100 ounces of FLUID daily  . Aim for at least 60-80 grams of PROTEIN daily . Look for a liquid protein source that contain ?15 g protein and ?5 g carbohydrate  (ex: shakes, drinks, shots) . Physical activity is an important part of a healthy lifestyle so keep it moving!  Follow diet recommendations listed below Energy and Macronutrient Recommendations: Calories: 1800 Carbohydrate: 200 Protein: 135 Fat: 50  Demonstrated degree of understanding via:  Teach Back   Teaching Method Utilized:  Visual Auditory Hands on  Barriers to learning/adherence to lifestyle change: none  Patient to call the [facility] to enroll in Pre-Op and Post-Op Nutrition Education when surgery date is scheduled.

## 2017-04-18 ENCOUNTER — Ambulatory Visit: Payer: BLUE CROSS/BLUE SHIELD | Admitting: Cardiovascular Disease

## 2017-05-13 ENCOUNTER — Encounter: Payer: Self-pay | Admitting: Cardiovascular Disease

## 2017-05-17 ENCOUNTER — Ambulatory Visit: Payer: BLUE CROSS/BLUE SHIELD | Admitting: Registered"

## 2017-05-24 NOTE — Progress Notes (Signed)
Cardiology Office Note   Date:  05/26/2017   ID:  Olivia Brooks, DOB 08-21-1981, MRN [id number]  PCP:  Jake Samples, PA-C  Cardiologist:   Jenkins Rouge, MD   No chief complaint on file.     History of Present Illness: Olivia Brooks is a 36 y.o. female who presents for surgical clearance Goal is to have bariatric surgery and Then left hip surgery. She takes diuretic for HTN.  She is a former smoker  Referred by Dr Kieth Brightly  Apparently Dr Gaynelle Arabian who works out of [place] routinely orders ETT/Echo on pre bariatric Patients he ordered one on her 4 months ago and it was done in [place] I cannot get the results currently She said there was something wrong with the echo and he wanted her to see cardiologist. She has a history of heart murmur.   She does all ADL. She cares for her 80 yo son Olivia Brooks. She gets rare palpitations. No chest pain. Has been Tested for sleep apnea an does not have it. No general anesthesia before had spinal for c section 2 years ago. No bleeding diathesis. Seems motivated to have sleeve so she can get her hip fixed by Dr Maureen Ralphs and be more active Dr Kieth Brightly will do sleeve    Past Medical History:  Diagnosis Date  . HSV-2 seropositive   . Hypertension   . Medical history non-contributory   . Trichomonal vaginitis in pregnancy in second trimester 02/17/2015  . Trichomonas infection     Past Surgical History:  Procedure Laterality Date  . CESAREAN SECTION N/A 07/28/2015   Procedure: CESAREAN SECTION;  Surgeon: Truett Mainland, DO;  Location: [facility];  Service: Obstetrics;  Laterality: N/A;  . NO PAST SURGERIES       Current Outpatient Prescriptions  Medication Sig Dispense Refill  . hydrochlorothiazide (HYDRODIURIL) 25 MG tablet Take 1 tablet (25 mg total) by mouth daily. 90 tablet 3  . losartan (COZAAR) 25 MG tablet Take 25 mg by mouth daily.    . meloxicam (MOBIC) 15 MG tablet Take 15 mg by mouth daily.    .  Norgestimate-Ethinyl Estradiol Triphasic (TRI-PREVIFEM) 0.18/0.215/0.25 MG-35 MCG tablet 1 TABLET DAILY ORAL FOR 28 DAYS    . omeprazole (PRILOSEC) 20 MG capsule Take 20 mg by mouth daily.    . Vitamin D, Ergocalciferol, (DRISDOL) 50000 units CAPS capsule Take 50,000 Units by mouth every 30 (thirty) days.     No current facility-administered medications for this visit.     Allergies:   Patient has no known allergies.    Social History:  The patient  reports that she has quit smoking. Her smoking use included Cigarettes. She quit after 10.00 years of use. She has never used smokeless tobacco. She reports that she drinks alcohol. She reports that she does not use drugs.   Family History:  The patient's family history includes Cancer in her mother; Hyperlipidemia in her brother; Suicidality in her father.    ROS:  Please see the history of present illness.   Otherwise, review of systems are positive for none.   All other systems are reviewed and negative.    PHYSICAL EXAM: VS:  BP (!) 144/92   Pulse 72   Ht 5\' 8"  (1.727 m)   Wt (!) 331 lb (150.1 kg)   SpO2 98%   BMI 50.33 kg/m  , BMI Body mass index is 50.33 kg/m. Affect appropriate Obese black female  HEENT: normal Neck supple with  no adenopathy JVP normal no bruits no thyromegaly Lungs clear with no wheezing and good diaphragmatic motion Heart:  S1/S2 1/6 SEM  murmur, no rub, gallop or click PMI normal Abdomen: benighn, BS positve, no tenderness, no AAA no bruit.  No HSM or HJR Distal pulses intact with no bruits No edema Neuro non-focal Skin warm and dry No muscular weakness    EKG:  SR rate 67 PAC poor R wave progression   Recent Labs: No results found for requested labs within last 8760 hours.    Lipid Panel No results found for: CHOL, TRIG, HDL, CHOLHDL, VLDL, LDLCALC, LDLDIRECT    Wt Readings from Last 3 Encounters:  05/26/17 (!) 331 lb (150.1 kg)  04/14/17 (!) 330 lb 11.2 oz (150 kg)  12/13/16 (!) 322  lb (146.1 kg)      Other studies Reviewed: Additional studies/ records that were reviewed today include: Notes Dr Kieth Brightly and Maureen Ralphs .    ASSESSMENT AND PLAN:  1. Preop: will try to locate echo done 4 months ago. However she has no high risk history and murmur is benign so she is clear to proceed with bariatric surgery and then orthopedic surgery  2. HTN  Well controlled.  Continue current medications and low sodium Dash type diet.   3. PAC;s benign previously on beta blocker can resume if PACls become symptomatic.  4. Ortho Left hip pain PRN tylenol/NSAI's f/u [facility] after bariatric surgery to plan THR   Current medicines are reviewed at length with the patient today.  The patient does not have concerns regarding medicines.  The following changes have been made:  no change  Labs/ tests ordered today include: None  No orders of the defined types were placed in this encounter.    Disposition:   FU with cardiology PRN      Signed, Jenkins Rouge, MD  05/26/2017 11:17 AM    [address], [place], [place]  [address] Phone: [phone]; Fax: [phone]

## 2017-05-26 ENCOUNTER — Encounter: Payer: Self-pay | Admitting: Cardiovascular Disease

## 2017-05-26 ENCOUNTER — Ambulatory Visit (INDEPENDENT_AMBULATORY_CARE_PROVIDER_SITE_OTHER): Payer: BLUE CROSS/BLUE SHIELD | Admitting: Cardiovascular Disease

## 2017-05-26 VITALS — BP 144/92 | HR 72 | Ht 68.0 in | Wt 331.0 lb

## 2017-05-26 DIAGNOSIS — Z01818 Encounter for other preprocedural examination: Secondary | ICD-10-CM | POA: Diagnosis not present

## 2017-05-26 NOTE — Patient Instructions (Addendum)
Medication Instructions:  Your physician recommends that you continue on your current medications as directed. Please refer to the Current Medication list given to you today.  Labwork: NONE  Testing/Procedures: NONE  Follow-Up: Your physician wants you to follow-up as neede with Dr. Johnsie Cancel.   If you need a refill on your cardiac medications before your next appointment, please call your pharmacy.

## 2017-05-27 NOTE — Addendum Note (Signed)
Addended by: Campbell Riches on: 05/27/2017 07:25 AM   Modules accepted: Orders

## 2017-08-01 ENCOUNTER — Encounter: Payer: BLUE CROSS/BLUE SHIELD | Attending: General Surgery | Admitting: Skilled Nursing Facility1

## 2017-08-01 ENCOUNTER — Encounter: Payer: Self-pay | Admitting: Skilled Nursing Facility1

## 2017-08-01 DIAGNOSIS — Z713 Dietary counseling and surveillance: Secondary | ICD-10-CM | POA: Diagnosis not present

## 2017-08-01 DIAGNOSIS — E669 Obesity, unspecified: Secondary | ICD-10-CM

## 2017-08-01 NOTE — Progress Notes (Signed)
Pre-Operative Nutrition Class:  Appt start time: 1601   End time:  1830.  Patient was seen on 08/01/2017 for Pre-Operative Bariatric Surgery Education [facility].   Surgery date:  Surgery type: Sleeve Start weight at [facility]: 325.1 Weight today: 330.7  Samples given per MNT protocol. Patient educated on appropriate usage: Kinnelon Multivitamin Lot # [id number] Exp: 02/2019  Bariatric Advantage Calcium  Lot # [id number] Exp: oct-11-2017  Unjury Protein Powder  Lot # [id number] Exp: 07/2018 The following the learning objectives were met by the patient during this course:  Identify Pre-Op Dietary Goals and will begin 2 weeks pre-operatively  Identify appropriate sources of fluids and proteins   State protein recommendations and appropriate sources pre and post-operatively  Identify Post-Operative Dietary Goals and will follow for 2 weeks post-operatively  Identify appropriate multivitamin and calcium sources  Describe the need for physical activity post-operatively and will follow MD recommendations  State when to call healthcare provider regarding medication questions or post-operative complications  Handouts given during class include:  Pre-Op Bariatric Surgery Diet Handout  Protein Shake Handout  Post-Op Bariatric Surgery Nutrition Handout  [facility] Program Information Flyer  Support Group Information Flyer  [facility] Bariatric Supplements Price List  Follow-Up Plan: Patient will follow-up at [facility] 2 weeks post operatively for diet advancement per MD.

## 2017-08-02 ENCOUNTER — Telehealth: Payer: Self-pay | Admitting: Skilled Nursing Facility1

## 2017-08-02 NOTE — Telephone Encounter (Signed)
Pt had some questions about pre-op foods and dietitian answered them.

## 2017-08-03 ENCOUNTER — Encounter (HOSPITAL_COMMUNITY): Payer: Self-pay

## 2017-08-03 NOTE — Patient Instructions (Addendum)
Olivia Brooks  08/03/2017   Your procedure is scheduled on: Monday, Dec. 17, 2018   Report to [facility] Main  Entrance   Take Martha  elevators to 3rd floor to  [place] at 8:00 AM.   Call this number if you have problems the morning of surgery [phone]   Remember: ONLY 1 PERSON MAY GO WITH YOU TO SHORT STAY TO GET  READY MORNING OF [place].   Do not eat food or drink liquids :After Midnight.    Take these medicines the morning of surgery with A SIP OF WATER: tylenol if needed                               You may not have any metal on your body including hair pins, jewelry, and body  piercings              Do not wear make-up, lotions, powders, perfumes, or deodorant             Do not wear nail polish.  Do not shave  48 hours prior to surgery.                Do not bring valuables to the hospital. [place].   Contacts, dentures or bridgework may not be worn into surgery.   Leave suitcase in the car. After surgery it may be brought to your room.              Please read over the following fact sheets you were given: _____________________________________________________________________             [facility] - Preparing for Surgery Before surgery, you can play an important role.  Because skin is not sterile, your skin needs to be as free of germs as possible.  You can reduce the number of germs on your skin by washing with CHG (chlorahexidine gluconate) soap before surgery.  CHG is an antiseptic cleaner which kills germs and bonds with the skin to continue killing germs even after washing. Please DO NOT use if you have an allergy to CHG or antibacterial soaps.  If your skin becomes reddened/irritated stop using the CHG and inform your nurse when you arrive at Short Stay. Do not shave (including legs and underarms) for at least 48 hours prior to the first CHG shower.  You may shave  your face/neck.  Please follow these instructions carefully:  1.  Shower with CHG Soap the night before surgery and the  morning of surgery.  2.  If you choose to wash your hair, wash your hair first as usual with your normal  shampoo.  3.  After you shampoo, rinse your hair and body thoroughly to remove the shampoo.                             4.  Use CHG as you would any other liquid soap.  You can apply chg directly to the skin and wash.  Gently with a scrungie or clean washcloth.  5.  Apply the CHG Soap to your body ONLY FROM THE NECK DOWN.   Do not use on face/ open  Wound or open sores. Avoid contact with eyes, ears mouth and genitals (private parts).                       Wash face,  Genitals (private parts) with your normal soap.             6.  Wash thoroughly, paying special attention to the area where your surgery  will be performed.  7.  Thoroughly rinse your body with warm water from the neck down.  8.  DO NOT shower/wash with your normal soap after using and rinsing off the CHG Soap.                9.  Pat yourself dry with a clean towel.            10.  Wear clean pajamas.            11.  Place clean sheets on your bed the night of your first shower and do not  sleep with pets. Day of Surgery : Do not apply any lotions/deodorants the morning of surgery.  Please wear clean clothes to the hospital/surgery center.  FAILURE TO FOLLOW THESE INSTRUCTIONS MAY RESULT IN THE CANCELLATION OF YOUR SURGERY  PATIENT SIGNATURE_________________________________  NURSE SIGNATURE__________________________________  ________________________________________________________________________

## 2017-08-03 NOTE — Pre-Procedure Instructions (Signed)
CXR 12/14/16 on chart The following are in epic: Surgical clearance Johnsie Cancel) 05/26/17 EKG 05/26/17

## 2017-08-04 ENCOUNTER — Ambulatory Visit: Payer: Self-pay | Admitting: General Surgery

## 2017-08-11 ENCOUNTER — Encounter (HOSPITAL_COMMUNITY): Payer: Self-pay

## 2017-08-11 ENCOUNTER — Encounter (HOSPITAL_COMMUNITY)
Admission: RE | Admit: 2017-08-11 | Discharge: 2017-08-11 | Disposition: A | Discharge status: Home / Self Care | Payer: BLUE CROSS/BLUE SHIELD | Source: Ambulatory Visit | Attending: General Surgery | Admitting: General Surgery

## 2017-08-11 ENCOUNTER — Other Ambulatory Visit: Payer: Self-pay

## 2017-08-11 DIAGNOSIS — Z6841 Body Mass Index (BMI) 40.0 and over, adult: Secondary | ICD-10-CM | POA: Diagnosis not present

## 2017-08-11 DIAGNOSIS — Z01812 Encounter for preprocedural laboratory examination: Secondary | ICD-10-CM | POA: Insufficient documentation

## 2017-08-11 DIAGNOSIS — E669 Obesity, unspecified: Secondary | ICD-10-CM | POA: Diagnosis not present

## 2017-08-11 HISTORY — DX: Cardiac murmur, unspecified: R01.1

## 2017-08-11 LAB — CBC WITH DIFFERENTIAL/PLATELET
Basophils Absolute: 0 10*3/uL (ref 0.0–0.1)
Basophils Relative: 0 %
EOS ABS: 0.1 10*3/uL (ref 0.0–0.7)
EOS PCT: 1 %
HCT: 37.5 % (ref 36.0–46.0)
HEMOGLOBIN: 12.1 g/dL (ref 12.0–15.0)
LYMPHS ABS: 2.4 10*3/uL (ref 0.7–4.0)
LYMPHS PCT: 29 %
MCH: 25.3 pg — AB (ref 26.0–34.0)
MCHC: 32.3 g/dL (ref 30.0–36.0)
MCV: 78.5 fL (ref 78.0–100.0)
MONOS PCT: 4 %
Monocytes Absolute: 0.3 10*3/uL (ref 0.1–1.0)
NEUTROS PCT: 66 %
Neutro Abs: 5.5 10*3/uL (ref 1.7–7.7)
Platelets: 240 10*3/uL (ref 150–400)
RBC: 4.78 MIL/uL (ref 3.87–5.11)
RDW: 15.3 % (ref 11.5–15.5)
WBC: 8.2 10*3/uL (ref 4.0–10.5)

## 2017-08-11 LAB — COMPREHENSIVE METABOLIC PANEL
ALT: 27 U/L (ref 14–54)
ANION GAP: 11 (ref 5–15)
AST: 25 U/L (ref 15–41)
Albumin: 4.1 g/dL (ref 3.5–5.0)
Alkaline Phosphatase: 72 U/L (ref 38–126)
BILIRUBIN TOTAL: 0.7 mg/dL (ref 0.3–1.2)
BUN: 14 mg/dL (ref 6–20)
CALCIUM: 9.3 mg/dL (ref 8.9–10.3)
CO2: 26 mmol/L (ref 22–32)
CREATININE: 0.99 mg/dL (ref 0.44–1.00)
Chloride: 98 mmol/L — ABNORMAL LOW (ref 101–111)
Glucose, Bld: 90 mg/dL (ref 65–99)
Potassium: 3.9 mmol/L (ref 3.5–5.1)
SODIUM: 135 mmol/L (ref 135–145)
TOTAL PROTEIN: 7.7 g/dL (ref 6.5–8.1)

## 2017-08-12 ENCOUNTER — Encounter (HOSPITAL_COMMUNITY): Payer: Self-pay | Admitting: *Deleted

## 2017-08-12 LAB — ABO/RH: ABO/RH(D): A POS

## 2017-08-15 ENCOUNTER — Inpatient Hospital Stay (HOSPITAL_COMMUNITY): Payer: BLUE CROSS/BLUE SHIELD | Admitting: Anesthesiology

## 2017-08-15 ENCOUNTER — Encounter (HOSPITAL_COMMUNITY)
Admission: RE | Disposition: A | Discharge status: Home / Self Care | Payer: Self-pay | Source: Ambulatory Visit | Attending: General Surgery

## 2017-08-15 ENCOUNTER — Other Ambulatory Visit: Payer: Self-pay

## 2017-08-15 ENCOUNTER — Encounter (HOSPITAL_COMMUNITY): Payer: Self-pay | Admitting: *Deleted

## 2017-08-15 ENCOUNTER — Inpatient Hospital Stay (HOSPITAL_COMMUNITY)
Admission: RE | Admit: 2017-08-15 | Discharge: 2017-08-16 | DRG: 621 | Disposition: A | Discharge status: Home / Self Care | Payer: BLUE CROSS/BLUE SHIELD | Source: Ambulatory Visit | Attending: General Surgery | Admitting: General Surgery

## 2017-08-15 DIAGNOSIS — Z87891 Personal history of nicotine dependence: Secondary | ICD-10-CM | POA: Diagnosis not present

## 2017-08-15 DIAGNOSIS — Z6841 Body Mass Index (BMI) 40.0 and over, adult: Secondary | ICD-10-CM

## 2017-08-15 DIAGNOSIS — K449 Diaphragmatic hernia without obstruction or gangrene: Secondary | ICD-10-CM | POA: Diagnosis present

## 2017-08-15 DIAGNOSIS — I10 Essential (primary) hypertension: Secondary | ICD-10-CM | POA: Diagnosis present

## 2017-08-15 HISTORY — PX: LAPAROSCOPIC GASTRIC SLEEVE RESECTION: SHX5895

## 2017-08-15 LAB — HEMOGLOBIN AND HEMATOCRIT, BLOOD
HCT: 35.6 % — ABNORMAL LOW (ref 36.0–46.0)
HEMOGLOBIN: 11.5 g/dL — AB (ref 12.0–15.0)

## 2017-08-15 LAB — TYPE AND SCREEN
ABO/RH(D): A POS
ANTIBODY SCREEN: NEGATIVE

## 2017-08-15 LAB — HCG, SERUM, QUALITATIVE: PREG SERUM: NEGATIVE

## 2017-08-15 SURGERY — GASTRECTOMY, SLEEVE, LAPAROSCOPIC
Anesthesia: General

## 2017-08-15 MED ORDER — MEPERIDINE HCL 50 MG/ML IJ SOLN: 6.2500 mg | INTRAMUSCULAR | Status: DC | PRN

## 2017-08-15 MED ORDER — EPHEDRINE 5 MG/ML INJ
INTRAVENOUS | Status: AC
  Filled 2017-08-15: qty 10

## 2017-08-15 MED ORDER — SUGAMMADEX SODIUM 200 MG/2ML IV SOLN
INTRAVENOUS | Status: DC | PRN
  Administered 2017-08-15: 300 mg via INTRAVENOUS

## 2017-08-15 MED ORDER — SIMETHICONE 80 MG PO CHEW: 80.0000 mg | CHEWABLE_TABLET | Freq: Four times a day (QID) | ORAL | Status: DC | PRN

## 2017-08-15 MED ORDER — ROCURONIUM BROMIDE 50 MG/5ML IV SOSY
PREFILLED_SYRINGE | INTRAVENOUS | Status: AC
  Filled 2017-08-15: qty 5

## 2017-08-15 MED ORDER — MIDAZOLAM HCL 2 MG/2ML IJ SOLN
INTRAMUSCULAR | Status: AC
  Filled 2017-08-15: qty 2

## 2017-08-15 MED ORDER — LACTATED RINGERS IV SOLN: INTRAVENOUS | Status: DC

## 2017-08-15 MED ORDER — ENOXAPARIN SODIUM 30 MG/0.3ML ~~LOC~~ SOLN
30.0000 mg | Freq: Two times a day (BID) | SUBCUTANEOUS | Status: DC
  Administered 2017-08-15 – 2017-08-16 (×2): 30 mg via SUBCUTANEOUS
  Filled 2017-08-15 (×2): qty 0.3

## 2017-08-15 MED ORDER — BUPIVACAINE HCL (PF) 0.25 % IJ SOLN
INTRAMUSCULAR | Status: AC
  Filled 2017-08-15: qty 30

## 2017-08-15 MED ORDER — ONDANSETRON HCL 4 MG/2ML IJ SOLN
INTRAMUSCULAR | Status: DC | PRN
  Administered 2017-08-15: 4 mg via INTRAVENOUS

## 2017-08-15 MED ORDER — ACETAMINOPHEN 500 MG PO TABS
1000.0000 mg | ORAL_TABLET | ORAL | Status: AC
  Administered 2017-08-15: 1000 mg via ORAL
  Filled 2017-08-15: qty 2

## 2017-08-15 MED ORDER — LOSARTAN POTASSIUM 25 MG PO TABS
25.0000 mg | ORAL_TABLET | Freq: Every day | ORAL | Status: DC
  Administered 2017-08-15 – 2017-08-16 (×2): 25 mg via ORAL
  Filled 2017-08-15 (×2): qty 1

## 2017-08-15 MED ORDER — PROPOFOL 10 MG/ML IV BOLUS
INTRAVENOUS | Status: AC
  Filled 2017-08-15: qty 20

## 2017-08-15 MED ORDER — BUPIVACAINE-EPINEPHRINE (PF) 0.25% -1:200000 IJ SOLN
INTRAMUSCULAR | Status: AC
  Filled 2017-08-15: qty 30

## 2017-08-15 MED ORDER — LACTATED RINGERS IR SOLN
Status: DC | PRN
  Administered 2017-08-15: 1000 mL

## 2017-08-15 MED ORDER — DEXAMETHASONE SODIUM PHOSPHATE 10 MG/ML IJ SOLN
INTRAMUSCULAR | Status: AC
  Filled 2017-08-15: qty 1

## 2017-08-15 MED ORDER — GABAPENTIN 250 MG/5ML PO SOLN
200.0000 mg | Freq: Two times a day (BID) | ORAL | Status: DC
  Administered 2017-08-15 – 2017-08-16 (×2): 200 mg via ORAL
  Filled 2017-08-15 (×2): qty 4

## 2017-08-15 MED ORDER — GABAPENTIN 300 MG PO CAPS
300.0000 mg | ORAL_CAPSULE | ORAL | Status: AC
  Administered 2017-08-15: 300 mg via ORAL
  Filled 2017-08-15: qty 1

## 2017-08-15 MED ORDER — TRAMADOL HCL 50 MG PO TABS: 50.0000 mg | ORAL_TABLET | Freq: Four times a day (QID) | ORAL | Status: DC | PRN

## 2017-08-15 MED ORDER — ROCURONIUM BROMIDE 10 MG/ML (PF) SYRINGE
PREFILLED_SYRINGE | INTRAVENOUS | Status: DC | PRN
  Administered 2017-08-15: 10 mg via INTRAVENOUS
  Administered 2017-08-15: 50 mg via INTRAVENOUS

## 2017-08-15 MED ORDER — HYDRALAZINE HCL 20 MG/ML IJ SOLN: 10.0000 mg | INTRAMUSCULAR | Status: DC | PRN

## 2017-08-15 MED ORDER — APREPITANT 40 MG PO CAPS
40.0000 mg | ORAL_CAPSULE | ORAL | Status: AC
  Administered 2017-08-15: 40 mg via ORAL
  Filled 2017-08-15: qty 1

## 2017-08-15 MED ORDER — METOCLOPRAMIDE HCL 5 MG/ML IJ SOLN: 10.0000 mg | Freq: Once | INTRAMUSCULAR | Status: DC | PRN

## 2017-08-15 MED ORDER — PROPOFOL 10 MG/ML IV BOLUS
INTRAVENOUS | Status: DC | PRN
  Administered 2017-08-15: 200 mg via INTRAVENOUS

## 2017-08-15 MED ORDER — DEXAMETHASONE SODIUM PHOSPHATE 4 MG/ML IJ SOLN: 4.0000 mg | INTRAMUSCULAR | Status: DC

## 2017-08-15 MED ORDER — MIDAZOLAM HCL 2 MG/2ML IJ SOLN
INTRAMUSCULAR | Status: DC | PRN
  Administered 2017-08-15 (×2): 1 mg via INTRAVENOUS

## 2017-08-15 MED ORDER — ONDANSETRON HCL 4 MG/2ML IJ SOLN: 4.0000 mg | INTRAMUSCULAR | Status: DC | PRN

## 2017-08-15 MED ORDER — FENTANYL CITRATE (PF) 100 MCG/2ML IJ SOLN
25.0000 ug | INTRAMUSCULAR | Status: DC | PRN
Start: 2017-08-15 — End: 2017-08-15

## 2017-08-15 MED ORDER — BUPIVACAINE HCL 0.25 % IJ SOLN
INTRAMUSCULAR | Status: DC | PRN
  Administered 2017-08-15: 30 mL

## 2017-08-15 MED ORDER — SUCCINYLCHOLINE CHLORIDE 200 MG/10ML IV SOSY
PREFILLED_SYRINGE | INTRAVENOUS | Status: AC
  Filled 2017-08-15: qty 10

## 2017-08-15 MED ORDER — PREMIER PROTEIN SHAKE
2.0000 [oz_av] | ORAL | Status: DC
  Administered 2017-08-16 (×3): 2 [oz_av] via ORAL

## 2017-08-15 MED ORDER — SODIUM CHLORIDE 0.9 % IV SOLN
INTRAVENOUS | Status: DC
  Administered 2017-08-15 (×2): via INTRAVENOUS

## 2017-08-15 MED ORDER — OXYCODONE HCL 5 MG/5ML PO SOLN: 5.0000 mg | ORAL | Status: DC | PRN

## 2017-08-15 MED ORDER — ACETAMINOPHEN 160 MG/5ML PO SOLN
650.0000 mg | Freq: Four times a day (QID) | ORAL | Status: DC
  Administered 2017-08-15 – 2017-08-16 (×4): 650 mg via ORAL
  Filled 2017-08-15 (×4): qty 20.3

## 2017-08-15 MED ORDER — PANTOPRAZOLE SODIUM 40 MG IV SOLR
40.0000 mg | Freq: Every day | INTRAVENOUS | Status: DC
  Administered 2017-08-15: 40 mg via INTRAVENOUS
  Filled 2017-08-15: qty 40

## 2017-08-15 MED ORDER — CHLORHEXIDINE GLUCONATE 4 % EX LIQD: 60.0000 mL | Freq: Once | CUTANEOUS | Status: DC

## 2017-08-15 MED ORDER — ONDANSETRON HCL 4 MG/2ML IJ SOLN
INTRAMUSCULAR | Status: AC
  Filled 2017-08-15: qty 2

## 2017-08-15 MED ORDER — HEPARIN SODIUM (PORCINE) 5000 UNIT/ML IJ SOLN
5000.0000 [IU] | INTRAMUSCULAR | Status: AC
  Administered 2017-08-15: 5000 [IU] via SUBCUTANEOUS
  Filled 2017-08-15: qty 1

## 2017-08-15 MED ORDER — LIDOCAINE HCL 2 % IJ SOLN
INTRAMUSCULAR | Status: AC
  Filled 2017-08-15: qty 20

## 2017-08-15 MED ORDER — EPHEDRINE SULFATE-NACL 50-0.9 MG/10ML-% IV SOSY
PREFILLED_SYRINGE | INTRAVENOUS | Status: DC | PRN
  Administered 2017-08-15 (×2): 10 mg via INTRAVENOUS

## 2017-08-15 MED ORDER — BUPIVACAINE LIPOSOME 1.3 % IJ SUSP
20.0000 mL | Freq: Once | INTRAMUSCULAR | Status: AC
  Administered 2017-08-15: 20 mL
  Filled 2017-08-15: qty 20

## 2017-08-15 MED ORDER — LACTATED RINGERS IV SOLN
INTRAVENOUS | Status: DC
  Administered 2017-08-15 (×2): via INTRAVENOUS

## 2017-08-15 MED ORDER — CEFOTETAN DISODIUM-DEXTROSE 2-2.08 GM-%(50ML) IV SOLR
2.0000 g | INTRAVENOUS | Status: AC
  Administered 2017-08-15: 2 g via INTRAVENOUS
  Filled 2017-08-15: qty 50

## 2017-08-15 MED ORDER — FENTANYL CITRATE (PF) 250 MCG/5ML IJ SOLN
INTRAMUSCULAR | Status: AC
Start: 2017-08-15 — End: 2017-08-15
  Filled 2017-08-15: qty 5

## 2017-08-15 MED ORDER — LIDOCAINE 2% (20 MG/ML) 5 ML SYRINGE
INTRAMUSCULAR | Status: DC | PRN
  Administered 2017-08-15: 1.5 mg/kg/h via INTRAVENOUS

## 2017-08-15 MED ORDER — SUGAMMADEX SODIUM 500 MG/5ML IV SOLN
INTRAVENOUS | Status: AC
  Filled 2017-08-15: qty 5

## 2017-08-15 MED ORDER — FENTANYL CITRATE (PF) 100 MCG/2ML IJ SOLN
INTRAMUSCULAR | Status: DC | PRN
  Administered 2017-08-15: 150 ug via INTRAVENOUS
  Administered 2017-08-15 (×2): 50 ug via INTRAVENOUS

## 2017-08-15 MED ORDER — LIDOCAINE 2% (20 MG/ML) 5 ML SYRINGE
INTRAMUSCULAR | Status: DC | PRN
  Administered 2017-08-15: 100 mg via INTRAVENOUS

## 2017-08-15 MED ORDER — MORPHINE SULFATE (PF) 2 MG/ML IV SOLN
1.0000 mg | INTRAVENOUS | Status: DC | PRN
  Administered 2017-08-15: 1 mg via INTRAVENOUS
  Filled 2017-08-15: qty 1

## 2017-08-15 MED ORDER — SCOPOLAMINE 1 MG/3DAYS TD PT72
1.0000 | MEDICATED_PATCH | TRANSDERMAL | Status: DC
  Administered 2017-08-15: 1.5 mg via TRANSDERMAL
  Filled 2017-08-15: qty 1

## 2017-08-15 MED ORDER — LIDOCAINE 2% (20 MG/ML) 5 ML SYRINGE
INTRAMUSCULAR | Status: AC
  Filled 2017-08-15: qty 5

## 2017-08-15 MED ORDER — DEXAMETHASONE SODIUM PHOSPHATE 10 MG/ML IJ SOLN
INTRAMUSCULAR | Status: DC | PRN
  Administered 2017-08-15: 10 mg via INTRAVENOUS

## 2017-08-15 SURGICAL SUPPLY — 57 items
APPLIER CLIP 5 13 M/L LIGAMAX5 (MISCELLANEOUS)
APPLIER CLIP ROT 13.4 12 LRG (CLIP)
BAG LAPAROSCOPIC 12 15 PORT 16 (BASKET) ×1 IMPLANT
BAG RETRIEVAL 12/15 (BASKET) ×2
BAG RETRIEVAL 12/15MM (BASKET) ×1
BANDAGE ADH SHEER 1  50/CT (GAUZE/BANDAGES/DRESSINGS) ×18 IMPLANT
BENZOIN TINCTURE PRP APPL 2/3 (GAUZE/BANDAGES/DRESSINGS) ×3 IMPLANT
BLADE SURG SZ11 CARB STEEL (BLADE) ×3 IMPLANT
CABLE HIGH FREQUENCY MONO STRZ (ELECTRODE) ×3 IMPLANT
CHLORAPREP W/TINT 26ML (MISCELLANEOUS) ×6 IMPLANT
CLIP APPLIE 5 13 M/L LIGAMAX5 (MISCELLANEOUS) IMPLANT
CLIP APPLIE ROT 13.4 12 LRG (CLIP) IMPLANT
CLOSURE WOUND 1/2 X4 (GAUZE/BANDAGES/DRESSINGS) ×1
COVER SURGICAL LIGHT HANDLE (MISCELLANEOUS) ×3 IMPLANT
DRAIN CHANNEL 19F RND (DRAIN) IMPLANT
DRAPE UNIVERSAL PACK (DRAPES) ×3 IMPLANT
DRAPE UTILITY XL STRL (DRAPES) ×6 IMPLANT
ELECT REM PT RETURN 15FT ADLT (MISCELLANEOUS) ×3 IMPLANT
EVACUATOR SILICONE 100CC (DRAIN) IMPLANT
GAUZE SPONGE 4X4 12PLY STRL (GAUZE/BANDAGES/DRESSINGS) IMPLANT
GLOVE BIOGEL PI IND STRL 7.0 (GLOVE) ×1 IMPLANT
GLOVE BIOGEL PI INDICATOR 7.0 (GLOVE) ×2
GLOVE SURG SS PI 7.0 STRL IVOR (GLOVE) ×3 IMPLANT
GOWN STRL REUS W/TWL LRG LVL3 (GOWN DISPOSABLE) ×3 IMPLANT
GOWN STRL REUS W/TWL XL LVL3 (GOWN DISPOSABLE) ×9 IMPLANT
GRASPER SUT TROCAR 14GX15 (MISCELLANEOUS) ×3 IMPLANT
HANDLE STAPLE EGIA 4 XL (STAPLE) ×3 IMPLANT
HOVERMATT SINGLE USE (MISCELLANEOUS) ×3 IMPLANT
KIT BASIN OR (CUSTOM PROCEDURE TRAY) ×3 IMPLANT
MARKER SKIN DUAL TIP RULER LAB (MISCELLANEOUS) ×3 IMPLANT
NEEDLE SPNL 22GX3.5 QUINCKE BK (NEEDLE) ×3 IMPLANT
RELOAD EGIA 45 MED/THCK PURPLE (STAPLE) IMPLANT
RELOAD EGIA 60 MED/THCK PURPLE (STAPLE) ×9 IMPLANT
RELOAD EGIA BLACK ROTIC 45MM (STAPLE) IMPLANT
RELOAD TRI 2.0 60 XTHK VAS SUL (STAPLE) ×6 IMPLANT
SCISSORS LAP 5X45 EPIX DISP (ENDOMECHANICALS) IMPLANT
SET IRRIG TUBING LAPAROSCOPIC (IRRIGATION / IRRIGATOR) ×3 IMPLANT
SHEARS HARMONIC ACE PLUS 45CM (MISCELLANEOUS) ×3 IMPLANT
SLEEVE GASTRECTOMY 40FR VISIGI (MISCELLANEOUS) ×3 IMPLANT
SLEEVE XCEL OPT CAN 5 100 (ENDOMECHANICALS) ×6 IMPLANT
SOLUTION ANTI FOG 6CC (MISCELLANEOUS) ×3 IMPLANT
SPONGE LAP 18X18 X RAY DECT (DISPOSABLE) ×3 IMPLANT
STRIP CLOSURE SKIN 1/2X4 (GAUZE/BANDAGES/DRESSINGS) ×2 IMPLANT
SUT ETHIBOND 0 36 GRN (SUTURE) ×6 IMPLANT
SUT ETHILON 2 0 PS N (SUTURE) IMPLANT
SUT MNCRL AB 4-0 PS2 18 (SUTURE) ×3 IMPLANT
SUT VICRYL 0 TIES 12 18 (SUTURE) ×3 IMPLANT
SYR 20CC LL (SYRINGE) ×3 IMPLANT
SYR 50ML LL SCALE MARK (SYRINGE) ×3 IMPLANT
TOWEL OR 17X26 10 PK STRL BLUE (TOWEL DISPOSABLE) ×3 IMPLANT
TOWEL OR NON WOVEN STRL DISP B (DISPOSABLE) ×3 IMPLANT
TROCAR BLADELESS 15MM (ENDOMECHANICALS) ×3 IMPLANT
TROCAR BLADELESS OPT 5 100 (ENDOMECHANICALS) ×3 IMPLANT
TUBING CONNECTING 10 (TUBING) ×4 IMPLANT
TUBING CONNECTING 10' (TUBING) ×2
TUBING ENDO SMARTCAP PENTAX (MISCELLANEOUS) IMPLANT
TUBING INSUF HEATED (TUBING) ×3 IMPLANT

## 2017-08-15 NOTE — Progress Notes (Signed)
Hgb. 11.5- Hct. 35.6- results noted.

## 2017-08-15 NOTE — Anesthesia Preprocedure Evaluation (Addendum)
Anesthesia Evaluation  Patient identified by MRN, date of birth, ID band Patient awake    Reviewed: Allergy & Precautions, NPO status , Patient's Chart, lab work & pertinent test results  Airway Mallampati: II  TM Distance: >3 FB Neck ROM: Full    Dental no notable dental hx.    Pulmonary former smoker,    Pulmonary exam normal breath sounds clear to auscultation       Cardiovascular hypertension, Pt. on medications Normal cardiovascular exam Rhythm:Regular Rate:Normal     Neuro/Psych negative neurological ROS  negative psych ROS   GI/Hepatic negative GI ROS, Neg liver ROS,   Endo/Other  Morbid obesity  Renal/GU negative Renal ROS  negative genitourinary   Musculoskeletal negative musculoskeletal ROS (+)   Abdominal   Peds negative pediatric ROS (+)  Hematology negative hematology ROS (+)   Anesthesia Other Findings   Reproductive/Obstetrics negative OB ROS                            Anesthesia Physical Anesthesia Plan  ASA: III  Anesthesia Plan: General   Post-op Pain Management:    Induction: Intravenous  PONV Risk Score and Plan: 4 or greater and Ondansetron, Dexamethasone, Midazolam, Scopolamine patch - Pre-op and Treatment may vary due to age or medical condition  Airway Management Planned: Oral ETT  Additional Equipment:   Intra-op Plan:   Post-operative Plan: Extubation in OR  Informed Consent: I have reviewed the patients History and Physical, chart, labs and discussed the procedure including the risks, benefits and alternatives for the proposed anesthesia with the patient or authorized representative who has indicated his/her understanding and acceptance.   Dental advisory given  Plan Discussed with: CRNA  Anesthesia Plan Comments:         Anesthesia Quick Evaluation

## 2017-08-15 NOTE — Anesthesia Postprocedure Evaluation (Signed)
Anesthesia Post Note  Patient: Olivia Brooks  Procedure(s) Performed: LAPAROSCOPIC GASTRIC SLEEVE RESECTION, UPPER ENDO, HIATAL HERNIA REPAIR (N/A )     Patient location during evaluation: PACU Anesthesia Type: General Level of consciousness: awake and alert Pain management: pain level controlled Vital Signs Assessment: post-procedure vital signs reviewed and stable Respiratory status: spontaneous breathing, nonlabored ventilation, respiratory function stable and patient connected to nasal cannula oxygen Cardiovascular status: blood pressure returned to baseline and stable Postop Assessment: no apparent nausea or vomiting Anesthetic complications: no    Last Vitals:  Vitals:   08/15/17 1443 08/15/17 1544  BP: 137/86 131/77  Pulse: 71 74  Resp: 17 18  Temp: 37 C 36.6 C  SpO2: 100% 100%    Last Pain:  Vitals:   08/15/17 1544  TempSrc: Oral  PainSc:                  Montez Hageman

## 2017-08-15 NOTE — Op Note (Signed)
Preop Diagnosis: Obesity Class III  Postop Diagnosis: same  Procedure performed: laparoscopic Sleeve Gastrectomy  Assitant: Greer Pickerel  Indications:  The patient is a 36 y.o. year-old morbidly obese female who has been followed in the Bariatric Clinic as an outpatient. This patient was diagnosed with morbid obesity with a BMI of Body mass index is 46.15 kg/m. and significant co-morbidities including hypertension.  The patient was counseled extensively in the Bariatric Outpatient Clinic and after a thorough explanation of the risks and benefits of surgery (including death from complications, bowel leak, infection such as peritonitis and/or sepsis, internal hernia, bleeding, need for blood transfusion, bowel obstruction, organ failure, pulmonary embolus, deep venous thrombosis, wound infection, incisional hernia, skin breakdown, and others entailed on the consent form) and after a compliant diet and exercise program, the patient was scheduled for an elective laparoscopic sleeve gastrectomy.  Description of Operation:  Following informed consent, the patient was taken to the operating room and placed on the operating table in the supine position.  She had previously received prophylactic antibiotics and subcutaneous heparin for DVT prophylaxis in the pre-op holding area.  After induction of general endotracheal anesthesia by the anesthesiologist, the patient underwent placement of sequential compression devices, Foley catheter and an oro-gastric tube.  A timeout was confirmed by the surgery and anesthesia teams.  The patient was adequately padded at all pressure points and placed on a footboard to prevent slippage from the OR table during extremes of position during surgery.  She underwent a routine sterile prep and drape of her entire abdomen.    Next, A transverse incision was made under the left subcostal area and a 70mm optical viewing trocar was introduced into the peritoneal cavity.  Pneumoperitoneum was applied with a high flow and low pressure. A laparoscope was inserted to confirm placement. A extraperitoneal block was then placed at the lateral abdominal wall using exparel diluted with marcaine. 5 additional incisions were placed: 1 68mm trocar to the left of the midline. 1 additional 40mm trocar in the left lateral area, 1 40mm trocar in the right mid abdomen, 1 106mm trocar in the right subcostal area, and a Nathanson retractor was placed through a subxiphoid incision.  The UGI showed a small hiatal hernia. Therefore, the pars flaccida was incised with harmonic scalpel. The stomach was reduced but on dissection of the posterior crus there was a visible hernia with small sac. The sac was dissected free and 2 0 ethibond sutures placed in interrupted fashion. A calibration tube was passed to ensure appropriate size of the hiatus.   The fat pad at the GE junction was incised and the gastrodiaphragmatic ligament was divided using the Harmonic scalpel. Next, a hole was created through the lesser omentum along the greater curve of the stomach to enter the lesser sac. The vessels along the greater omentum were  Then ligated and divided using the Harmonic scalpel moving towards the spleen and then short gastric vessels were ligated and divided in the same fashion to fully mobilize the fundus. The left crus was identified to ensure completion of the dissection. Next the antrum was measured and dissection continued inferiorly along the greater curve towards the pylorus and stopped 6cm from the pylorus.   A 40Fr ViSiGi dilator was placed into the esophgaus and along the lesser curve of the stomach and placed on suction. 2 non-reinforced 37mm 4-35mm tristapler(s) followed by 3 9mm 3-64mm tristaplers were used to make the resection along the antrum being sure to stay well away  from the angularis by angling the jaws of the stapler towards the greater curve and later completing the resection staying  along the Rains and ensuring the fundus was not retained by appropriately retracting it lateral. Air was inserted through the [place] to perform a leak test showing no bubbles and a neutral lie of the stomach.  The assistant then went and performed an upper endoscopy and leak test. No bubbles were seen and the sleeve and antrum distended appropriately. The specimen was then placed in an endocatch bag and removed by the 67mm port. The fascia of the 38mm port was closed with a 0 vicryl by suture passer. Hemostasis was ensured. Pneumoperitoneum was evacuated, all ports were removed and all incisions closed with 4-0 monocryl suture in subcuticular fashion. Steristrips and bandaids were put in place for dressing. The patient awoke from anesthesia and was brought to pacu in stable condition. All counts were correct.  Estimated blood loss: <49ml  Specimens:  Sleeve gastrectomy  Local Anesthesia: 50 ml Exparel:0.5% Marcaine mix  Post-Op Plan:       Pain Management: PO, prn      Antibiotics: Prophylactic      Anticoagulation: Prophylactic, Starting now      Post Op Studies/Consults: Not applicable      Intended Discharge: within 48h      Intended Outpatient Follow-Up: Two Week      Intended Outpatient Studies: Not Applicable      Other: Not Applicable   Arta Bruce Kinsinger

## 2017-08-15 NOTE — Transfer of Care (Signed)
Immediate Anesthesia Transfer of Care Note  Patient: Olivia Brooks  Procedure(s) Performed: LAPAROSCOPIC GASTRIC SLEEVE RESECTION, UPPER ENDO, HIATAL HERNIA REPAIR (N/A )  Patient Location: PACU  Anesthesia Type:General  Level of Consciousness: awake  Airway & Oxygen Therapy: Patient Spontanous Breathing and Patient connected to face mask oxygen  Post-op Assessment: Report given to RN and Post -op Vital signs reviewed and stable  Post vital signs: Reviewed and stable  Last Vitals:  Vitals:   08/15/17 0826  BP: (!) 149/89  Pulse: 87  Resp: 18  Temp: 36.8 C  SpO2: 100%    Last Pain:  Vitals:   08/15/17 0826  TempSrc: Oral         Complications: No apparent anesthesia complications

## 2017-08-15 NOTE — Progress Notes (Signed)
Discussed post op day goals with patient including ambulation, IS, diet progression, pain, and nausea control.  Questions answered. 

## 2017-08-15 NOTE — Discharge Instructions (Signed)
° ° ° °GASTRIC BYPASS/SLEEVE ° Home Care Instructions ° ° These instructions are to help you care for yourself when you go home. ° °Call: If you have any problems. °• Call [phone] and ask for the surgeon on call °• If you need immediate help, come to the ER at [facility].  °• Tell the ER staff that you are a new post-op gastric bypass or gastric sleeve patient °  °Signs and symptoms to report: • Severe vomiting or nausea °o If you cannot keep down clear liquids for longer than 1 day, call your surgeon  °• Abdominal pain that does not get better after taking your pain medication °• Fever over 100.4° F with chills °• Heart beating over 100 beats a minute °• Shortness of breath at rest °• Chest pain °•  Redness, swelling, drainage, or foul odor at incision (surgical) sites °•  If your incisions open or pull apart °• Swelling or pain in calf (lower leg) °• Diarrhea (Loose bowel movements that happen often), frequent watery, uncontrolled bowel movements °• Constipation, (no bowel movements for 3 days) if this happens: Pick one °o Milk of Magnesia, 2 tablespoons by mouth, 3 times a day for 2 days if needed °o Stop taking Milk of Magnesia once you have a bowel movement °o Call your doctor if constipation continues °Or °o Miralax  (instead of Milk of Magnesia) following the label instructions °o Stop taking Miralax once you have a bowel movement °o Call your doctor if constipation continues °• Anything you think is not normal °  °Normal side effects after surgery: • Unable to sleep at night or unable to focus °• Irritability or moody °• Being tearful (crying) or depressed °These are common complaints, possibly related to your anesthesia medications that put you to sleep, stress of surgery, and change in lifestyle.  This usually goes away a few weeks after surgery.  If these feelings continue, call your primary care doctor. °  °Wound Care: You may have surgical glue, steri-strips, or staples over your incisions after  surgery °• Surgical glue:  Looks like a clear film over your incisions and will wear off a little at a time °• Steri-strips: Strips of tape over your incisions. You may notice a yellowish color on the skin under the steri-strips. This is used to make the   steri-strips stick better. Do not pull the steri-strips off - let them fall off °• Staples: Staples may be removed before you leave the hospital °o If you go home with staples, call [facility], [phone] at for an appointment with your surgeon’s nurse to have staples removed 10 days after surgery. °• Showering: You may shower two (2) days after your surgery unless your surgeon tells you differently °o Wash gently around incisions with warm soapy water, rinse well, and gently pat dry  °o No tub baths until staples are removed, steri-strips fall off or glue is gone.  °  °Medications: • Medications should be liquid or crushed if larger than the size of a dime °• Extended release pills (medication that release a little bit at a time through the day) should NOT be crushed or cut. (examples include XL, ER, DR, SR) °• Depending on the size and number of medications you take, you may need to space (take a few throughout the day)/change the time you take your medications so that you do not over-fill your pouch (smaller stomach) °• Make sure you follow-up with your primary care doctor to   make medication changes needed during rapid weight loss and life-style changes °• If you have diabetes, follow up with the doctor that orders your diabetes medication(s) within one week after surgery and check your blood sugar regularly. °• Do not drive while taking prescription pain medication  °• It is ok to take Tylenol by the bottle instructions with your pain medicine or instead of your pain medicine as needed.  DO NOT TAKE NSAIDS (EXAMPLES OF NSAIDS:  IBUPROFREN/ NAPROXEN)  °Diet:                    First 2 Weeks ° You will see the dietician t about two (2) weeks  after your surgery. The dietician will increase the types of foods you can eat if you are handling liquids well: °• If you have severe vomiting or nausea and cannot keep down clear liquids lasting longer than 1 day, call your surgeon @ ([phone]) °Protein Shake °• Drink at least 2 ounces of shake 5-6 times per day °• Each serving of protein shakes (usually 8 - 12 ounces) should have: °o 15 grams of protein  °o And no more than 5 grams of carbohydrate  °• Goal for protein each day: °o Men = 80 grams per day °o Women = 60 grams per day °• Protein powder may be added to fluids such as non-fat milk or Lactaid milk or unsweetened Soy/Almond milk (limit to 35 grams added protein powder per serving) ° °Hydration °• Slowly increase the amount of water and other clear liquids as tolerated (See Acceptable Fluids) °• Slowly increase the amount of protein shake as tolerated  °•  Sip fluids slowly and throughout the day.  Do not use straws. °• May use sugar substitutes in small amounts (no more than 6 - 8 packets per day; i.e. Splenda) ° °Fluid Goal °• The first goal is to drink at least 8 ounces of protein shake/drink per day (or as directed by the nutritionist); some examples of protein shakes are Syntrax Nectar, Adkins Advantage, EAS Edge HP, and Unjury. See handout from pre-op Bariatric Education Class: °o Slowly increase the amount of protein shake you drink as tolerated °o You may find it easier to slowly sip shakes throughout the day °o It is important to get your proteins in first °• Your fluid goal is to drink 64 - 100 ounces of fluid daily °o It may take a few weeks to build up to this °• 32 oz (or more) should be clear liquids  °And  °• 32 oz (or more) should be full liquids (see below for examples) °• Liquids should not contain sugar, caffeine, or carbonation ° °Clear Liquids: °• Water or Sugar-free flavored water (i.e. Fruit H2O, Propel) °• Decaffeinated coffee or tea (sugar-free) °• Crystal Lite, Wyler’s Lite,  Minute Maid Lite °• Sugar-free Jell-O °• Bouillon or broth °• Sugar-free Popsicle:   *Less than 20 calories each; Limit 1 per day ° °Full Liquids: °Protein Shakes/Drinks + 2 choices per day of other full liquids °• Full liquids must be: °o No More Than 15 grams of Carbs per serving  °o No More Than 3 grams of Fat per serving °• Strained low-fat cream soup (except Cream of Potato or Tomato) °• Non-Fat milk °• Fat-free Lactaid Milk °• Unsweetened Soy Or Unsweetened Almond Milk °• Low Sugar yogurt (Dannon Lite & Fit, Greek yogurt; Oikos Triple Zero; Chobani Simply 100; Yoplait 100 calorie Greek - No Fruit on the Bottom) ° °  °Vitamins   and Minerals • Start 1 day after surgery unless otherwise directed by your surgeon °• 2 Chewable Bariatric Specific Multivitamin / Multimineral Supplement with iron (Example: Bariatric Advantage Multi EA) °• Chewable Calcium with Vitamin D-3 °(Example: 3 Chewable Calcium Plus 600 with Vitamin D-3) °o Take 500 mg three (3) times a day for a total of 1500 mg each day °o Do not take all 3 doses of calcium at one time as it may cause constipation, and you can only absorb 500 mg  at a time  °o Do not mix multivitamins containing iron with calcium supplements; take 2 hours apart °• Menstruating women and those with a history of anemia (a blood disease that causes weakness) may need extra iron °o Talk with your doctor to see if you need more iron °• Do not stop taking or change any vitamins or minerals until you talk to your dietitian or surgeon °• Your Dietitian and/or surgeon must approve all vitamin and mineral supplements °  °Activity and Exercise: Limit your physical activity as instructed by your doctor.  It is important to continue walking at home.  During this time, use these guidelines: °• Do not lift anything greater than ten (10) pounds for at least two (2) weeks °• Do not go back to work or drive until your surgeon says you can °• You may have sex when you feel comfortable  °o It is  VERY important for female patients to use a reliable birth control method; fertility often increases after surgery  °o All hormonal birth control will be ineffective for 30 days after surgery due to medications given during surgery a barrier method must be used. °o Do not get pregnant for at least 18 months °• Start exercising as soon as your doctor tells you that you can °o Make sure your doctor approves any physical activity °• Start with a simple walking program °• Walk 5-15 minutes each day, 7 days per week.  °• Slowly increase until you are walking 30-45 minutes per day °Consider joining our BELT program. [phone] or email [email or web address] °  °Special Instructions Things to remember: °• Use your CPAP when sleeping if this applies to you ° °• [facility] has two free Bariatric Surgery Support Groups that meet monthly °o The 3rd Thursday of each month, 6 pm, [facility]  °o The 2nd Friday of each month, 11:45 am in the private dining room in the basement of  °• It is very important to keep all follow up appointments with your surgeon, dietitian, primary care physician, and behavioral health practitioner °• Routine follow up schedule with your surgeon include appointments at 2-3 weeks, 6-8 weeks, 6 months, and 1 year at a minimum.  Your surgeon may request to see you more often.   °o After the first year, please follow up with your bariatric surgeon and dietitian at least once a year in order to maintain best weight loss results °[facility]: [phone] °[facility]: [phone] °Bariatric Nurse Coordinator: [phone] °  °   Reviewed and Endorsed  °by [place] Patient Education Committee, June, 2016 °Edits Approved: Aug, 2018 ° ° ° °

## 2017-08-15 NOTE — Progress Notes (Signed)
Hgb. And Hct. Drawn by lab. 

## 2017-08-15 NOTE — H&P (Signed)
Olivia Brooks is an 36 y.o. female.   Chief Complaint: obesity HPI: 36 yo female presents for obesity surgery. She has completed all requirements and is ready to proceed.  Past Medical History:  Diagnosis Date  . Heart murmur    history of  . HSV-2 seropositive   . Hypertension   . Medical history non-contributory   . Trichomonal vaginitis in pregnancy in second trimester 02/17/2015  . Trichomonas infection     Past Surgical History:  Procedure Laterality Date  . CESAREAN SECTION N/A 07/28/2015   Procedure: CESAREAN SECTION;  Surgeon: Truett Mainland, DO;  Location: [facility];  Service: Obstetrics;  Laterality: N/A;  . NO PAST SURGERIES      Family History  Problem Relation Age of Onset  . Cancer Mother   . Suicidality Father   . Hyperlipidemia Brother    Social History:  reports that she quit smoking about 2 years ago. Her smoking use included cigarettes. She quit after 10.00 years of use. she has never used smokeless tobacco. She reports that she does not drink alcohol or use drugs.  Allergies: No Known Allergies  Medications Prior to Admission  Medication Sig Dispense Refill  . acetaminophen (TYLENOL) 650 MG CR tablet Take 650 mg by mouth every 8 (eight) hours as needed for pain.    . hydrochlorothiazide (HYDRODIURIL) 25 MG tablet Take 1 tablet (25 mg total) by mouth daily. 90 tablet 3  . losartan (COZAAR) 25 MG tablet Take 25 mg by mouth daily.    . meloxicam (MOBIC) 15 MG tablet Take 15 mg by mouth daily.    . Vitamin D, Ergocalciferol, (DRISDOL) 50000 units CAPS capsule Take 50,000 Units by mouth every Tuesday.     . Norgestimate-Ethinyl Estradiol Triphasic (TRI-PREVIFEM) 0.18/0.215/0.25 MG-35 MCG tablet Take 1 tablet by mouth daily      No results found for this or any previous visit (from the past 48 hour(s)). No results found.  Review of Systems  Constitutional: Negative for chills and fever.  HENT: Negative for hearing loss.   Eyes: Negative for blurred  vision and double vision.  Respiratory: Negative for cough and hemoptysis.   Cardiovascular: Negative for chest pain and palpitations.  Gastrointestinal: Negative for abdominal pain, nausea and vomiting.  Genitourinary: Negative for dysuria and urgency.  Musculoskeletal: Negative for myalgias and neck pain.  Skin: Negative for itching and rash.  Neurological: Negative for dizziness, tingling and headaches.  Endo/Heme/Allergies: Does not bruise/bleed easily.  Psychiatric/Behavioral: Negative for depression and suicidal ideas.    Blood pressure (!) 149/89, pulse 87, temperature 98.2 F (36.8 C), temperature source Oral, resp. rate 18, height 5' 8.5" (1.74 m), weight (!) 139.7 kg (308 lb), last menstrual period 08/08/2017, SpO2 100 %, not currently breastfeeding. Physical Exam  Vitals reviewed. Constitutional: She is oriented to person, place, and time. She appears well-developed and well-nourished.  HENT:  Head: Normocephalic and atraumatic.  Eyes: Conjunctivae and EOM are normal. Pupils are equal, round, and reactive to light.  Neck: Normal range of motion. Neck supple.  Cardiovascular: Normal rate and regular rhythm.  Respiratory: Effort normal and breath sounds normal.  GI: Soft. Bowel sounds are normal. She exhibits no distension. There is no tenderness.  Musculoskeletal: Normal range of motion.  Neurological: She is alert and oriented to person, place, and time.  Skin: Skin is warm and dry.  Psychiatric: She has a normal mood and affect. Her behavior is normal.     Assessment/Plan 36 yo female with class  III obesity. -lap sleeve -ERAS -post op bariatric protocol  Mickeal Skinner, MD 08/15/2017, 8:58 AM

## 2017-08-15 NOTE — Anesthesia Procedure Notes (Addendum)
Procedure Name: Intubation Date/Time: 08/15/2017 10:31 AM Performed by: Sharlette Dense, CRNA Patient Re-evaluated:Patient Re-evaluated prior to induction Oxygen Delivery Method: Circle system utilized Preoxygenation: Pre-oxygenation with 100% oxygen Induction Type: IV induction Ventilation: Mask ventilation without difficulty and Oral airway inserted - appropriate to patient size Laryngoscope Size: Sabra Heck and 2 Grade View: Grade I Tube type: Oral Tube size: 7.5 mm Number of attempts: 1 Airway Equipment and Method: Stylet Placement Confirmation: ETT inserted through vocal cords under direct vision,  breath sounds checked- equal and bilateral and positive ETCO2 Secured at: 22 cm Tube secured with: Tape Dental Injury: Teeth and Oropharynx as per pre-operative assessment

## 2017-08-15 NOTE — Op Note (Signed)
Olivia Brooks [id number] 18-Feb-1981 08/15/2017  Preoperative diagnosis: morbid obesity  Postoperative diagnosis: Same   Procedure: upper endoscopy   Surgeon: Leighton Ruff. Francisca Langenderfer M.D., FACS   Anesthesia: Gen.   Indications for procedure: 36 y.o. year old female undergoing Laparoscopic Gastric Sleeve Resection and an EGD was requested to evaluate the new gastric sleeve.   Description of procedure: After we have completed the sleeve resection, I scrubbed out and obtained the Olympus endoscope. I gently placed endoscope in the patient's oropharynx and gently glided it down the esophagus without any difficulty under direct visualization. Once I was in the gastric sleeve, I insufflated the stomach with air. I was able to cannulate and advanced the scope through the gastric sleeve. I was able to cannulate the duodenum with ease. Dr. Kieth Brightly had placed saline in the upper abdomen. Upon further insufflation of the gastric sleeve there was no evidence of bubbles. GE junction located at 44 cm.  Upon further inspection of the gastric sleeve, the mucosa appeared normal. There is no evidence of any mucosal abnormality. The sleeve was widely patent at the angularis. There was no evidence of bleeding. The gastric sleeve was decompressed. The scope was withdrawn. The patient tolerated this portion of the procedure well. Please see Dr Amie Portland operative note for details regarding the laparoscopic gastric sleeve resection.   Leighton Ruff. Redmond Pulling, MD, FACS  General, Bariatric, & Minimally Invasive Surgery  [facility], [place]

## 2017-08-16 LAB — COMPREHENSIVE METABOLIC PANEL
ALK PHOS: 64 U/L (ref 38–126)
ALT: 34 U/L (ref 14–54)
AST: 30 U/L (ref 15–41)
Albumin: 3.4 g/dL — ABNORMAL LOW (ref 3.5–5.0)
Anion gap: 9 (ref 5–15)
BUN: 11 mg/dL (ref 6–20)
CALCIUM: 9.1 mg/dL (ref 8.9–10.3)
CHLORIDE: 103 mmol/L (ref 101–111)
CO2: 24 mmol/L (ref 22–32)
CREATININE: 0.92 mg/dL (ref 0.44–1.00)
GFR calc Af Amer: >60 mL/min (ref >60)
Glucose, Bld: 111 mg/dL — ABNORMAL HIGH (ref 65–99)
Potassium: 3.7 mmol/L (ref 3.5–5.1)
Sodium: 136 mmol/L (ref 135–145)
Total Bilirubin: 1 mg/dL (ref 0.3–1.2)
Total Protein: 6.9 g/dL (ref 6.5–8.1)

## 2017-08-16 LAB — CBC WITH DIFFERENTIAL/PLATELET
BASOS ABS: 0 10*3/uL (ref 0.0–0.1)
Basophils Relative: 0 %
EOS PCT: 0 %
Eosinophils Absolute: 0 10*3/uL (ref 0.0–0.7)
HEMATOCRIT: 33.9 % — AB (ref 36.0–46.0)
HEMOGLOBIN: 10.8 g/dL — AB (ref 12.0–15.0)
LYMPHS PCT: 14 %
Lymphs Abs: 0.9 10*3/uL (ref 0.7–4.0)
MCH: 24.8 pg — AB (ref 26.0–34.0)
MCHC: 31.9 g/dL (ref 30.0–36.0)
MCV: 77.8 fL — AB (ref 78.0–100.0)
Monocytes Absolute: 0.2 10*3/uL (ref 0.1–1.0)
Monocytes Relative: 3 %
NEUTROS ABS: 5.2 10*3/uL (ref 1.7–7.7)
NEUTROS PCT: 83 %
PLATELETS: 225 10*3/uL (ref 150–400)
RBC: 4.36 MIL/uL (ref 3.87–5.11)
RDW: 15.4 % (ref 11.5–15.5)
WBC: 6.3 10*3/uL (ref 4.0–10.5)

## 2017-08-16 NOTE — Progress Notes (Signed)
Patient alert and oriented, Post op day 1.  Provided support and encouragement.  Encouraged pulmonary toilet, ambulation and small sips of liquids.  All questions answered.  Will continue to monitor. 

## 2017-08-16 NOTE — Discharge Summary (Signed)
Physician Discharge Summary  Olivia Brooks EHM:[id number] DOB: [birth year] DOA: 08/15/2017  PCP: Jake Samples, PA-C  Admit date: 08/15/2017 Discharge date: 08/16/2017  Recommendations for Outpatient Follow-up:  1.  (include homehealth, outpatient follow-up instructions, specific recommendations for PCP to follow-up on, etc.)  Follow-up Information    Katherleen Folkes, Arta Bruce, MD. Go on 08/31/2017.   Specialty:  General Surgery Why:  at [address] information: [place] [address] [phone]        Whittley Carandang, Arta Bruce, MD Follow up.   Specialty:  General Surgery Contact information: [place] [address] [phone]          Discharge Diagnoses:  Active Problems:   Morbid obesity [facility])   Surgical Procedure: Laparoscopic Sleeve Gastrectomy, upper endoscopy  Discharge Condition: Good Disposition: Home  Diet recommendation: Postoperative sleeve gastrectomy diet (liquids only)  Filed Weights   08/15/17 0826 08/16/17 0610  Weight: (!) 139.7 kg (308 lb) 129.3 kg (285 lb)     Hospital Course:  The patient was admitted after undergoing laparoscopic sleeve gastrectomy. POD 0 she ambulated well. POD 1 she was started on the water diet protocol and tolerated 500 ml in the first shift. Once meeting the water amount she was advanced to bariatric protein shakes which they tolerated and were discharged home POD 1.  Treatments: surgery: laparoscopic sleeve gastrectomy  Discharge Instructions  Discharge Instructions    Ambulate hourly while awake   Complete by:  As directed    Call MD for:  difficulty breathing, headache or visual disturbances   Complete by:  As directed    Call MD for:  persistant dizziness or light-headedness   Complete by:  As directed    Call MD for:  persistant nausea and vomiting   Complete by:  As directed    Call MD for:  redness, tenderness, or signs of infection (pain,  swelling, redness, odor or green/yellow discharge around incision site)   Complete by:  As directed    Call MD for:  severe uncontrolled pain   Complete by:  As directed    Call MD for:  temperature >101 F   Complete by:  As directed    Diet bariatric full liquid   Complete by:  As directed    Discharge wound care:   Complete by:  As directed    Remove Bandaids tomorrow, ok to shower tomorrow. Steristrips may fall off in 1-3 weeks.   Incentive spirometry   Complete by:  As directed    Perform hourly while awake     Allergies as of 08/16/2017   No Known Allergies     Medication List    STOP taking these medications   hydrochlorothiazide 25 MG tablet Commonly known as:  HYDRODIURIL   meloxicam 15 MG tablet Commonly known as:  MOBIC     TAKE these medications   acetaminophen 650 MG CR tablet Commonly known as:  TYLENOL Take 650 mg by mouth every 8 (eight) hours as needed for pain.   losartan 25 MG tablet Commonly known as:  COZAAR Take 25 mg by mouth daily. Notes to patient:  Monitor Blood Pressure Daily and keep a log for primary care physician.  You may need to make changes to your medications with rapid weight loss.     polyethylene glycol packet Commonly known as:  MIRALAX / GLYCOLAX Take 17 g by mouth daily.   TRI-PREVIFEM 0.18/0.215/0.25 MG-35 MCG tablet  Generic drug:  Norgestimate-Ethinyl Estradiol Triphasic Take 1 tablet by mouth daily   Vitamin D (Ergocalciferol) 50000 units Caps capsule Commonly known as:  DRISDOL Take 50,000 Units by mouth every Tuesday.            Discharge Care Instructions  (From admission, onward)        Start     Ordered   08/16/17 0000  Discharge wound care:    Comments:  Remove Bandaids tomorrow, ok to shower tomorrow. Steristrips may fall off in 1-3 weeks.   08/16/17 0626     Follow-up Information    Davari Lopes, Arta Bruce, MD. Go on 08/31/2017.   Specialty:  General Surgery Why:  at [address] information: [place] [address] [phone]        Elgene Coral, Arta Bruce, MD Follow up.   Specialty:  General Surgery Contact information: [place] [address] [phone]            The results of significant diagnostics from this hospitalization (including imaging, microbiology, ancillary and laboratory) are listed below for reference.    Significant Diagnostic Studies: No results found.  Labs: Basic Metabolic Panel: Recent Labs  Lab 08/11/17 1445 08/16/17 0417  NA 135 136  K 3.9 3.7  CL 98* 103  CO2 26 24  GLUCOSE 90 111*  BUN 14 11  CREATININE 0.99 0.92  CALCIUM 9.3 9.1   Liver Function Tests: Recent Labs  Lab 08/11/17 1445 08/16/17 0417  AST 25 30  ALT 27 34  ALKPHOS 72 64  BILITOT 0.7 1.0  PROT 7.7 6.9  ALBUMIN 4.1 3.4*    CBC: Recent Labs  Lab 08/11/17 1445 08/15/17 1230 08/16/17 0417  WBC 8.2  --  6.3  NEUTROABS 5.5  --  5.2  HGB 12.1 11.5* 10.8*  HCT 37.5 35.6* 33.9*  MCV 78.5  --  77.8*  PLT 240  --  225    CBG: No results for input(s): GLUCAP in the last 168 hours.  Active Problems:   Morbid obesity ([place])   Time coordinating discharge: <91min

## 2017-08-16 NOTE — Progress Notes (Signed)
Patient alert and oriented, pain is controlled. Patient is tolerating fluids, advanced to protein shake today, patient is tolerating well.  Reviewed Gastric sleeve discharge instructions with patient and patient is able to articulate understanding.  Provided information on BELT program, Support Group and [facility] outpatient pharmacy. All questions answered, will continue to monitor.  

## 2017-08-16 NOTE — Progress Notes (Signed)
Discharge instructions given to patient. Questions answered; patient denied further questions. Per Doretha Sou, sec/tech, DME has been delivered. No prescriptions given to patient. Family member is coming to drive patient home. Donne Hazel, RN

## 2017-08-18 ENCOUNTER — Telehealth (HOSPITAL_COMMUNITY): Payer: Self-pay

## 2017-08-18 NOTE — Telephone Encounter (Signed)
Follow up with bariatric surgical patient to discuss post discharge questions.    1.  Are you having any pain not relieved by pain medication?not needed pain medication  2.  How much fluid total fluid intake have you had in the last 24/48 hours?  26 ounces, we discussed increasing fluids, patient focused on drinking 2 ounces every hour.  We discussed new strategies including sipping instead of trying to bolus drink 2 ounces of fluid  3.  How much protein intake have you had in the last 24/48 hours?30 grams  4.  Have you had any trouble making urine?making urine, color or lemonade  5.  Have you had nausea that has not been relieved by nausea medication?has had no nausea  6.  Are you ambulating every hour?yes  7.  Are you passing gas or had a BM?had BM today  8.  Do you know how to contact [place]? [facility]? [facility]?yes  9.  Are you taking your vitamins and calcium without difficulty?yes  10. Tell me how your incision looks?  Any redness, open incision, or drainage?no problems

## 2017-08-31 ENCOUNTER — Encounter: Payer: Self-pay | Admitting: Skilled Nursing Facility1

## 2017-08-31 ENCOUNTER — Encounter: Payer: BLUE CROSS/BLUE SHIELD | Attending: General Surgery | Admitting: Skilled Nursing Facility1

## 2017-08-31 DIAGNOSIS — Z713 Dietary counseling and surveillance: Secondary | ICD-10-CM | POA: Diagnosis present

## 2017-08-31 NOTE — Progress Notes (Signed)
Bariatric Class:  Appt start time: 1530 end time:  1630.  2 Week Post-Operative Nutrition Class  Patient was seen on 08/31/2016 for Post-Operative Nutrition education at [facility].   Surgery date: 08/15/2017 Surgery type: Sleeve Start weight at [facility]: 330.7 Weight today: 305.6  TANITA  BODY COMP RESULTS  08/31/2016   BMI (kg/m^2) 46.5   Fat Mass (lbs) 159.8   Fat Free Mass (lbs) 148.8   Total Body Water (lbs) 109.2   The following the learning objectives were met by the patient during this course:  Identifies Phase 3A (Soft, High Proteins) Dietary Goals and will begin from 2 weeks post-operatively to 2 months post-operatively  Identifies appropriate sources of fluids and proteins   States protein recommendations and appropriate sources post-operatively  Identifies the need for appropriate texture modifications, mastication, and bite sizes when consuming solids  Identifies appropriate multivitamin and calcium sources post-operatively  Describes the need for physical activity post-operatively and will follow MD recommendations  States when to call healthcare provider regarding medication questions or post-operative complications  Handouts given during class include:  Phase 3A: Soft, High Protein Diet Handout  Follow-Up Plan: Patient will follow-up at [facility] in 6 weeks for 2 month post-op nutrition visit for diet advancement per MD.

## 2017-09-12 ENCOUNTER — Telehealth: Payer: Self-pay | Admitting: Registered"

## 2017-09-12 NOTE — Telephone Encounter (Signed)
Pt had questions about protein powder option to drink 4 weeks post-op. Pt states she is having a hard time meeting protein and fluid goals because fluid makes her feel full. Pt states she is getting tired of eating the same things. RD answered pts questions about acceptable protein powders/drink options. Pt was encouraged to eat protein each time she eats to help increase intake.

## 2017-10-12 ENCOUNTER — Encounter: Payer: Self-pay | Admitting: Registered"

## 2017-10-12 ENCOUNTER — Encounter: Payer: BLUE CROSS/BLUE SHIELD | Attending: General Surgery | Admitting: Registered"

## 2017-10-12 DIAGNOSIS — Z713 Dietary counseling and surveillance: Secondary | ICD-10-CM | POA: Diagnosis not present

## 2017-10-12 DIAGNOSIS — E669 Obesity, unspecified: Secondary | ICD-10-CM

## 2017-10-12 NOTE — Progress Notes (Signed)
Follow-up visit: 8 Weeks Post-Operative Sleeve Gastrectomy Surgery  Medical Nutrition Therapy:  Appt start time: 11:48 end time: 12:20.  Primary concerns today: Post-operative Bariatric Surgery Nutrition Management.  Non scale victories: none stated  Surgery date: 08/15/2017 Surgery type: Sleeve Start weight at [facility]: 330.7 Weight today: 283.4 Weight change: 22.2 lbs loss 305.6 (08/31/2017) Total weight lost: 47.3 lbs Weight loss goal: none stated   TANITA  BODY COMP RESULTS  08/31/2016 10/12/2017   BMI (kg/m^2) 46.5 43.1   Fat Mass (lbs) 159.8 144.0   Fat Free Mass (lbs) 148.8 139.4   Total Body Water (lbs) 109.2 103.6   Pt arrives with 37 year old son. Pt states she has not been taking vitamins or calcium supplements for 2 weeks because she did not like Bariatric [employer]vantage and was told she could do prescription based multivitamin regimen. Pt states she was told she had to pay $450 first and she does not want to do that. Pt was given sample of [employer] chewable multivitamin Lot # [id number]  Exp 02/2019 Pt was encouraged to begin taking    Preferred Learning Style:   No preference indicated   Learning Readiness:   Ready  Change in progress  24-hr recall: B (AM): 1/2 protein shake (15g) Snk (AM): none  L (PM): 2 oz ham (14g), 1/2 cheese stick (3g) Snk (PM): 1 oz [place] (7g)  D (PM): 1/2 protein shake (15g) Snk (PM): 2 oz [place] (14g) + 1/2 cheese stick (3g)  Fluid intake: water (3-4 bottles), decaf coffee; 64+ oz Estimated total protein intake: 60+ grams  Medications: See list Supplementation: none for the past 2 weeks  Using straws: occasionally Drinking while eating: tries not to unless choking, doesn't bother her Having you been chewing well:yes Chewing/swallowing difficulties: no Changes in vision: started seeing a gray line after surgery, consulted eye doctor Changes to mood/headaches: no Hair loss/Changes to skin/Changes to nails: no, no, no Any  difficulty focusing or concentrating: no Sweating: no Dizziness/Lightheaded: no Palpitations: no  Carbonated beverages: no N/V/D/C/GAS: no, no, no, no, yes when eating beans Abdominal Pain: no Dumping syndrome: no Last Lap-Band fill: N/A  Recent physical activity:  ADL's; limited physical ability due to needing hip replacement  Progress Towards Goal(s):  In progress.  Handouts given during visit include:  Phase 3B: High Protein + NS vegetables   Nutritional Diagnosis:  [place]-3.3 Overweight/obesity related to past poor dietary habits and physical inactivity as evidenced by patient w/ recent sleeve gastrectomy surgery following dietary guidelines for continued weight loss.    Intervention:  Nutrition education and counseling.  Goals:  Follow Phase 3B: High Protein + Non-Starchy Vegetables  Eat 3-6 small meals/snacks, every 3-5 hrs  Increase lean protein foods to meet 60g goal  Increase fluid intake to 64oz +  Avoid drinking 15 minutes before, during and 30 minutes after eating  Aim for >30 min of physical activity daily  Teaching Method Utilized:  Visual Auditory Hands on  Barriers to learning/adherence to lifestyle change: none identified  Demonstrated degree of understanding via:  Teach Back   Monitoring/Evaluation:  Dietary intake, exercise, lap band fills, and body weight. Follow up in 4 months for 6 month post-op visit.

## 2017-10-12 NOTE — Patient Instructions (Signed)
Goals:  Follow Phase 3B: High Protein + Non-Starchy Vegetables  Eat 3-6 small meals/snacks, every 3-5 hrs  Increase lean protein foods to meet 60g goal  Increase fluid intake to 64oz +  Avoid drinking 15 minutes before, during and 30 minutes after eating  Aim for >30 min of physical activity daily  

## 2017-11-28 DEATH — deceased

## 2018-01-02 ENCOUNTER — Other Ambulatory Visit: Payer: Self-pay | Admitting: General Surgery

## 2018-02-06 ENCOUNTER — Ambulatory Visit: Payer: BLUE CROSS/BLUE SHIELD | Admitting: Registered"
